# Patient Record
Sex: Female | Born: 1945 | Race: White | Hispanic: No | Marital: Married | Smoking: Never smoker
Health system: Southern US, Community
[De-identification: ages and names within clinical notes are randomized; demographics above are authoritative.]

## PROBLEM LIST (undated history)

## (undated) DIAGNOSIS — E7800 Pure hypercholesterolemia, unspecified: Secondary | ICD-10-CM

## (undated) DIAGNOSIS — E119 Type 2 diabetes mellitus without complications: Secondary | ICD-10-CM

## (undated) DIAGNOSIS — I10 Essential (primary) hypertension: Secondary | ICD-10-CM

## (undated) DIAGNOSIS — J302 Other seasonal allergic rhinitis: Secondary | ICD-10-CM

## (undated) DIAGNOSIS — M4800 Spinal stenosis, site unspecified: Secondary | ICD-10-CM

## (undated) HISTORY — PX: BREAST SURGERY: SHX581

## (undated) HISTORY — PX: CHOLECYSTECTOMY: SHX55

## (undated) HISTORY — PX: ABDOMINAL HYSTERECTOMY: SHX81

## (undated) HISTORY — PX: DILATION AND CURETTAGE OF UTERUS: SHX78

---

## 1997-09-30 ENCOUNTER — Ambulatory Visit (HOSPITAL_COMMUNITY): Admission: RE | Admit: 1997-09-30 | Discharge: 1997-09-30 | Payer: Self-pay | Admitting: *Deleted

## 2000-07-10 ENCOUNTER — Other Ambulatory Visit: Admission: RE | Admit: 2000-07-10 | Discharge: 2000-07-10 | Payer: Self-pay | Admitting: Otolaryngology

## 2002-07-15 ENCOUNTER — Encounter: Payer: Self-pay | Admitting: Orthopedic Surgery

## 2002-07-22 ENCOUNTER — Ambulatory Visit (HOSPITAL_COMMUNITY): Admission: RE | Admit: 2002-07-22 | Discharge: 2002-07-22 | Payer: Self-pay | Admitting: Orthopedic Surgery

## 2003-03-16 ENCOUNTER — Encounter: Admission: RE | Admit: 2003-03-16 | Discharge: 2003-06-14 | Payer: Self-pay | Admitting: Family Medicine

## 2003-04-09 ENCOUNTER — Ambulatory Visit (HOSPITAL_COMMUNITY): Admission: RE | Admit: 2003-04-09 | Discharge: 2003-04-09 | Payer: Self-pay | Admitting: *Deleted

## 2004-06-19 ENCOUNTER — Encounter: Admission: RE | Admit: 2004-06-19 | Discharge: 2004-06-19 | Payer: Self-pay | Admitting: Orthopedic Surgery

## 2004-06-23 ENCOUNTER — Emergency Department (HOSPITAL_COMMUNITY): Admission: EM | Admit: 2004-06-23 | Discharge: 2004-06-24 | Payer: Self-pay | Admitting: Emergency Medicine

## 2005-03-21 ENCOUNTER — Ambulatory Visit: Payer: Self-pay | Admitting: Hematology and Oncology

## 2006-07-17 ENCOUNTER — Encounter: Admission: RE | Admit: 2006-07-17 | Discharge: 2006-07-17 | Payer: Self-pay | Admitting: Orthopedic Surgery

## 2006-07-23 ENCOUNTER — Encounter: Admission: RE | Admit: 2006-07-23 | Discharge: 2006-07-23 | Payer: Self-pay | Admitting: *Deleted

## 2012-02-26 ENCOUNTER — Other Ambulatory Visit: Payer: Self-pay | Admitting: Orthopedic Surgery

## 2012-02-26 DIAGNOSIS — M48061 Spinal stenosis, lumbar region without neurogenic claudication: Secondary | ICD-10-CM

## 2012-03-03 ENCOUNTER — Ambulatory Visit
Admission: RE | Admit: 2012-03-03 | Discharge: 2012-03-03 | Disposition: A | Discharge status: Home / Self Care | Payer: Self-pay | Source: Ambulatory Visit | Attending: Orthopedic Surgery | Admitting: Orthopedic Surgery

## 2012-03-03 ENCOUNTER — Ambulatory Visit
Admission: RE | Admit: 2012-03-03 | Discharge: 2012-03-03 | Disposition: A | Discharge status: Home / Self Care | Payer: Medicare Other | Source: Ambulatory Visit | Attending: Orthopedic Surgery | Admitting: Orthopedic Surgery

## 2012-03-03 ENCOUNTER — Other Ambulatory Visit: Payer: Self-pay | Admitting: Orthopedic Surgery

## 2012-03-03 VITALS — BP 116/73 | HR 79 | Ht 59.0 in | Wt 200.0 lb

## 2012-03-03 DIAGNOSIS — M549 Dorsalgia, unspecified: Secondary | ICD-10-CM

## 2012-03-03 DIAGNOSIS — M48061 Spinal stenosis, lumbar region without neurogenic claudication: Secondary | ICD-10-CM

## 2012-03-03 MED ORDER — IOHEXOL 180 MG/ML  SOLN
15.0000 mL | Freq: Once | INTRAMUSCULAR | Status: AC | PRN
  Administered 2012-03-03: 15 mL via INTRATHECAL

## 2012-03-03 MED ORDER — DIAZEPAM 5 MG PO TABS
5.0000 mg | ORAL_TABLET | Freq: Once | ORAL | Status: AC
  Administered 2012-03-03: 5 mg via ORAL

## 2012-04-01 ENCOUNTER — Encounter (HOSPITAL_COMMUNITY): Payer: Self-pay | Admitting: Pharmacy Technician
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
## 2012-04-04 ENCOUNTER — Ambulatory Visit (HOSPITAL_COMMUNITY)
Admission: RE | Admit: 2012-04-04 | Discharge: 2012-04-04 | Disposition: A | Discharge status: Home / Self Care | Payer: Medicare Other | Source: Ambulatory Visit | Attending: Orthopedic Surgery | Admitting: Orthopedic Surgery

## 2012-04-04 ENCOUNTER — Encounter (HOSPITAL_COMMUNITY)
Admission: RE | Admit: 2012-04-04 | Discharge: 2012-04-04 | Disposition: A | Discharge status: Home / Self Care | Payer: Medicare Other | Source: Ambulatory Visit | Attending: Orthopedic Surgery | Admitting: Orthopedic Surgery

## 2012-04-04 ENCOUNTER — Encounter (HOSPITAL_COMMUNITY): Payer: Self-pay

## 2012-04-04 DIAGNOSIS — M4800 Spinal stenosis, site unspecified: Secondary | ICD-10-CM | POA: Insufficient documentation

## 2012-04-04 DIAGNOSIS — S22009A Unspecified fracture of unspecified thoracic vertebra, initial encounter for closed fracture: Secondary | ICD-10-CM | POA: Insufficient documentation

## 2012-04-04 DIAGNOSIS — X58XXXA Exposure to other specified factors, initial encounter: Secondary | ICD-10-CM | POA: Insufficient documentation

## 2012-04-04 DIAGNOSIS — Z01812 Encounter for preprocedural laboratory examination: Secondary | ICD-10-CM | POA: Insufficient documentation

## 2012-04-04 HISTORY — DX: Spinal stenosis, site unspecified: M48.00

## 2012-04-04 HISTORY — DX: Essential (primary) hypertension: I10

## 2012-04-04 HISTORY — DX: Type 2 diabetes mellitus without complications: E11.9

## 2012-04-04 HISTORY — DX: Other seasonal allergic rhinitis: J30.2

## 2012-04-04 HISTORY — DX: Pure hypercholesterolemia, unspecified: E78.00

## 2012-04-04 LAB — CBC
Hemoglobin: 13.6 g/dL (ref 12.0–15.0)
RBC: 4.61 MIL/uL (ref 3.87–5.11)
WBC: 9.6 10*3/uL (ref 4.0–10.5)

## 2012-04-04 LAB — URINE MICROSCOPIC-ADD ON

## 2012-04-04 LAB — URINALYSIS, ROUTINE W REFLEX MICROSCOPIC
Bilirubin Urine: NEGATIVE
Glucose, UA: NEGATIVE mg/dL
Hgb urine dipstick: NEGATIVE
Ketones, ur: NEGATIVE mg/dL
Nitrite: NEGATIVE
Protein, ur: NEGATIVE mg/dL
Specific Gravity, Urine: 1.021 (ref 1.005–1.030)
Urobilinogen, UA: 0.2 mg/dL (ref 0.0–1.0)
pH: 5 (ref 5.0–8.0)

## 2012-04-04 LAB — COMPREHENSIVE METABOLIC PANEL
ALT: 22 U/L (ref 0–35)
AST: 26 U/L (ref 0–37)
Albumin: 4.1 g/dL (ref 3.5–5.2)
Alkaline Phosphatase: 80 U/L (ref 39–117)
BUN: 25 mg/dL — ABNORMAL HIGH (ref 6–23)
CO2: 26 mEq/L (ref 19–32)
Calcium: 10.4 mg/dL (ref 8.4–10.5)
Chloride: 102 mEq/L (ref 96–112)
Creatinine, Ser: 1.03 mg/dL (ref 0.50–1.10)
GFR calc Af Amer: 65 mL/min — ABNORMAL LOW (ref >90)
GFR calc non Af Amer: 56 mL/min — ABNORMAL LOW (ref >90)
Glucose, Bld: 102 mg/dL — ABNORMAL HIGH (ref 70–99)
Potassium: 4.9 mEq/L (ref 3.5–5.1)
Sodium: 138 mEq/L (ref 135–145)
Total Bilirubin: 0.4 mg/dL (ref 0.3–1.2)
Total Protein: 8 g/dL (ref 6.0–8.3)

## 2012-04-04 LAB — PROTIME-INR
INR: 1.05 (ref 0.00–1.49)
Prothrombin Time: 13.6 seconds (ref 11.6–15.2)

## 2012-04-04 LAB — APTT: aPTT: 41 seconds — ABNORMAL HIGH (ref 24–37)

## 2012-04-04 LAB — SURGICAL PCR SCREEN
MRSA, PCR: NEGATIVE
Staphylococcus aureus: NEGATIVE

## 2012-04-04 NOTE — Pre-Procedure Instructions (Addendum)
04-04-12 CXR, Lumbar Spine X-ray done today . EKG 12'13 -report with chart. 04-04-12 1410- fax to Dr. Darrelyn Hillock -labs viewable in Epic.Cindy Glover

## 2012-04-04 NOTE — Progress Notes (Signed)
04-04-12 Labs viewable in Epic with Presurgical testing visit. Note please.W. Kennon Portela

## 2012-04-04 NOTE — Patient Instructions (Addendum)
20 Cindy Glover  04/04/2012   Your procedure is scheduled on:  1- 15 -2014  Report to [facility] at   0900     AM..  Call this number if you have problems the morning of surgery: [phone]  Or Presurgical Testing [phone](Cathi Hazan)   Remember: Follow any bowel prep instructions per MD office. For Cpap use: Bring mask and tubing only.   Do not eat food:After Midnight.    Take these medicines the morning of surgery with A SIP OF WATER: Caduet, Zetia. Do not take any Diabetic meds Am of Sugery.   Do not wear jewelry, make-up or nail polish.  Do not wear lotions, powders, or perfumes. You may wear deodorant.  Do not shave 12 hours prior to first CHG shower(legs and under arms).(face and neck okay.)  Do not bring valuables to the hospital.  Contacts, dentures or bridgework,body piercing,  may not be worn into surgery.  Leave suitcase in the car. After surgery it may be brought to your room.  For patients admitted to the hospital, checkout time is 11:00 AM the day of discharge.   Patients discharged the day of surgery will not be allowed to drive home. Must have responsible person with you x 24 hours once discharged.  Name and phone number of your driver:Mike Coffin,spouse [phone] cell   Special Instructions: CHG Shower Use Special Wash: see special instructions.(avoid face and genitals)   Please read over the following fact sheets that you were given: MRSA Information, Incentive Spirometry Instruction.    Failure to follow these instructions may result in Cancellation of your surgery.   Patient signature_______________________________________________________

## 2012-04-09 ENCOUNTER — Encounter (HOSPITAL_COMMUNITY)
Admission: RE | Disposition: A | Discharge status: Home / Self Care | Payer: Self-pay | Source: Ambulatory Visit | Attending: Orthopedic Surgery

## 2012-04-09 ENCOUNTER — Inpatient Hospital Stay (HOSPITAL_COMMUNITY): Payer: Medicare Other | Admitting: Anesthesiology

## 2012-04-09 ENCOUNTER — Encounter (HOSPITAL_COMMUNITY): Payer: Self-pay | Admitting: Anesthesiology

## 2012-04-09 ENCOUNTER — Encounter (HOSPITAL_COMMUNITY): Payer: Self-pay | Admitting: *Deleted

## 2012-04-09 ENCOUNTER — Inpatient Hospital Stay (HOSPITAL_COMMUNITY): Payer: Medicare Other

## 2012-04-09 ENCOUNTER — Inpatient Hospital Stay (HOSPITAL_COMMUNITY)
Admission: RE | Admit: 2012-04-09 | Discharge: 2012-04-11 | DRG: 490 | Disposition: A | Discharge status: Home / Self Care | Payer: Medicare Other | Source: Ambulatory Visit | Attending: Orthopedic Surgery | Admitting: Orthopedic Surgery

## 2012-04-09 DIAGNOSIS — I10 Essential (primary) hypertension: Secondary | ICD-10-CM | POA: Diagnosis present

## 2012-04-09 DIAGNOSIS — M5416 Radiculopathy, lumbar region: Secondary | ICD-10-CM | POA: Diagnosis present

## 2012-04-09 DIAGNOSIS — IMO0002 Reserved for concepts with insufficient information to code with codable children: Secondary | ICD-10-CM | POA: Diagnosis present

## 2012-04-09 DIAGNOSIS — E7800 Pure hypercholesterolemia, unspecified: Secondary | ICD-10-CM | POA: Diagnosis present

## 2012-04-09 DIAGNOSIS — E119 Type 2 diabetes mellitus without complications: Secondary | ICD-10-CM | POA: Diagnosis present

## 2012-04-09 DIAGNOSIS — M48061 Spinal stenosis, lumbar region without neurogenic claudication: Principal | ICD-10-CM | POA: Diagnosis present

## 2012-04-09 DIAGNOSIS — Z9889 Other specified postprocedural states: Secondary | ICD-10-CM

## 2012-04-09 DIAGNOSIS — Z6841 Body Mass Index (BMI) 40.0 and over, adult: Secondary | ICD-10-CM

## 2012-04-09 HISTORY — PX: DECOMPRESSIVE LUMBAR LAMINECTOMY LEVEL 2: SHX5792

## 2012-04-09 LAB — GLUCOSE, CAPILLARY: Glucose-Capillary: 163 mg/dL — ABNORMAL HIGH (ref 70–99)

## 2012-04-09 SURGERY — DECOMPRESSIVE LUMBAR LAMINECTOMY LEVEL 2
Anesthesia: General | Site: Spine Lumbar | Wound class: Clean

## 2012-04-09 MED ORDER — RAMIPRIL 10 MG PO CAPS
10.0000 mg | ORAL_CAPSULE | Freq: Every day | ORAL | Status: DC
  Administered 2012-04-10 – 2012-04-11 (×2): 10 mg via ORAL
  Filled 2012-04-09 (×2): qty 1

## 2012-04-09 MED ORDER — PROPOFOL 10 MG/ML IV BOLUS
INTRAVENOUS | Status: DC | PRN
  Administered 2012-04-09: 150 mg via INTRAVENOUS

## 2012-04-09 MED ORDER — ROCURONIUM BROMIDE 100 MG/10ML IV SOLN
INTRAVENOUS | Status: DC | PRN
  Administered 2012-04-09 (×2): 10 mg via INTRAVENOUS
  Administered 2012-04-09: 30 mg via INTRAVENOUS

## 2012-04-09 MED ORDER — BISACODYL 10 MG RE SUPP: 10.0000 mg | Freq: Every day | RECTAL | Status: DC | PRN

## 2012-04-09 MED ORDER — THROMBIN 5000 UNITS EX SOLR
CUTANEOUS | Status: DC | PRN
  Administered 2012-04-09: 5000 [IU] via TOPICAL

## 2012-04-09 MED ORDER — MEPERIDINE HCL 50 MG/ML IJ SOLN: 6.2500 mg | INTRAMUSCULAR | Status: DC | PRN

## 2012-04-09 MED ORDER — ONDANSETRON HCL 4 MG/2ML IJ SOLN
4.0000 mg | INTRAMUSCULAR | Status: DC | PRN
  Administered 2012-04-09 – 2012-04-10 (×3): 4 mg via INTRAVENOUS
  Filled 2012-04-09 (×3): qty 2

## 2012-04-09 MED ORDER — GLIPIZIDE ER 10 MG PO TB24
10.0000 mg | ORAL_TABLET | Freq: Every day | ORAL | Status: DC
  Administered 2012-04-10 – 2012-04-11 (×2): 10 mg via ORAL
  Filled 2012-04-09 (×3): qty 1

## 2012-04-09 MED ORDER — HEMOSTATIC AGENTS (NO CHARGE) OPTIME
TOPICAL | Status: DC | PRN
  Administered 2012-04-09: 1 via TOPICAL

## 2012-04-09 MED ORDER — ATORVASTATIN CALCIUM 40 MG PO TABS
40.0000 mg | ORAL_TABLET | Freq: Every day | ORAL | Status: DC
  Administered 2012-04-10 – 2012-04-11 (×2): 40 mg via ORAL
  Filled 2012-04-09 (×2): qty 1

## 2012-04-09 MED ORDER — PROMETHAZINE HCL 25 MG/ML IJ SOLN: 6.2500 mg | INTRAMUSCULAR | Status: DC | PRN

## 2012-04-09 MED ORDER — BUPIVACAINE LIPOSOME 1.3 % IJ SUSP
20.0000 mL | Freq: Once | INTRAMUSCULAR | Status: AC
  Administered 2012-04-09: 20 mL
  Filled 2012-04-09: qty 20

## 2012-04-09 MED ORDER — SUCCINYLCHOLINE CHLORIDE 20 MG/ML IJ SOLN
INTRAMUSCULAR | Status: DC | PRN
  Administered 2012-04-09: 100 mg via INTRAVENOUS

## 2012-04-09 MED ORDER — HYDROCHLOROTHIAZIDE 25 MG PO TABS
25.0000 mg | ORAL_TABLET | Freq: Every day | ORAL | Status: DC | PRN
  Filled 2012-04-09: qty 1

## 2012-04-09 MED ORDER — METHOCARBAMOL 100 MG/ML IJ SOLN
500.0000 mg | Freq: Four times a day (QID) | INTRAVENOUS | Status: DC | PRN
  Administered 2012-04-09 (×2): 500 mg via INTRAVENOUS
  Filled 2012-04-09 (×2): qty 5

## 2012-04-09 MED ORDER — SODIUM CHLORIDE 0.9 % IR SOLN
Status: DC | PRN
  Administered 2012-04-09: 13:00:00

## 2012-04-09 MED ORDER — ACETAMINOPHEN 10 MG/ML IV SOLN
INTRAVENOUS | Status: DC | PRN
  Administered 2012-04-09: 1000 mg via INTRAVENOUS

## 2012-04-09 MED ORDER — OXYCODONE HCL 5 MG PO TABS: 5.0000 mg | ORAL_TABLET | Freq: Once | ORAL | Status: DC | PRN

## 2012-04-09 MED ORDER — BACITRACIN-NEOMYCIN-POLYMYXIN 400-5-5000 EX OINT
TOPICAL_OINTMENT | CUTANEOUS | Status: DC | PRN
  Administered 2012-04-09: 1 via TOPICAL

## 2012-04-09 MED ORDER — POLYETHYLENE GLYCOL 3350 17 G PO PACK: 17.0000 g | PACK | Freq: Every day | ORAL | Status: DC | PRN

## 2012-04-09 MED ORDER — ACETAMINOPHEN 650 MG RE SUPP: 650.0000 mg | RECTAL | Status: DC | PRN

## 2012-04-09 MED ORDER — AMLODIPINE-ATORVASTATIN 10-40 MG PO TABS: 1.0000 | ORAL_TABLET | Freq: Every morning | ORAL | Status: DC

## 2012-04-09 MED ORDER — CEFAZOLIN SODIUM-DEXTROSE 2-3 GM-% IV SOLR
2.0000 g | INTRAVENOUS | Status: AC
  Administered 2012-04-09: 2 g via INTRAVENOUS

## 2012-04-09 MED ORDER — LACTATED RINGERS IV SOLN
INTRAVENOUS | Status: DC
  Administered 2012-04-09 – 2012-04-10 (×2): via INTRAVENOUS

## 2012-04-09 MED ORDER — PANTOPRAZOLE SODIUM 40 MG PO TBEC
40.0000 mg | DELAYED_RELEASE_TABLET | Freq: Every day | ORAL | Status: DC
  Administered 2012-04-10 – 2012-04-11 (×2): 40 mg via ORAL
  Filled 2012-04-09 (×3): qty 1

## 2012-04-09 MED ORDER — METHOCARBAMOL 500 MG PO TABS
500.0000 mg | ORAL_TABLET | Freq: Four times a day (QID) | ORAL | Status: DC | PRN
  Administered 2012-04-10 (×2): 500 mg via ORAL
  Filled 2012-04-09 (×2): qty 1

## 2012-04-09 MED ORDER — MENTHOL 3 MG MT LOZG
1.0000 | LOZENGE | OROMUCOSAL | Status: DC | PRN
  Administered 2012-04-11: 3 mg via ORAL
  Filled 2012-04-09: qty 9

## 2012-04-09 MED ORDER — BUPIVACAINE-EPINEPHRINE PF 0.25-1:200000 % IJ SOLN
INTRAMUSCULAR | Status: DC | PRN
  Administered 2012-04-09: 30 mL

## 2012-04-09 MED ORDER — OXYCODONE-ACETAMINOPHEN 5-325 MG PO TABS
1.0000 | ORAL_TABLET | ORAL | Status: DC | PRN
  Administered 2012-04-09 – 2012-04-10 (×3): 1 via ORAL
  Administered 2012-04-10: 2 via ORAL
  Administered 2012-04-10 – 2012-04-11 (×3): 1 via ORAL
  Filled 2012-04-09 (×2): qty 1
  Filled 2012-04-09: qty 2
  Filled 2012-04-09 (×4): qty 1

## 2012-04-09 MED ORDER — NEOSTIGMINE METHYLSULFATE 1 MG/ML IJ SOLN
INTRAMUSCULAR | Status: DC | PRN
  Administered 2012-04-09: 5 mg via INTRAVENOUS

## 2012-04-09 MED ORDER — INSULIN ASPART 100 UNIT/ML ~~LOC~~ SOLN
0.0000 [IU] | Freq: Three times a day (TID) | SUBCUTANEOUS | Status: DC
Start: 2012-04-09 — End: 2012-04-11
  Administered 2012-04-09: 2 [IU] via SUBCUTANEOUS

## 2012-04-09 MED ORDER — FLEET ENEMA 7-19 GM/118ML RE ENEM: 1.0000 | ENEMA | Freq: Once | RECTAL | Status: AC | PRN

## 2012-04-09 MED ORDER — ACETAMINOPHEN 325 MG PO TABS
650.0000 mg | ORAL_TABLET | ORAL | Status: DC | PRN
  Administered 2012-04-10: 650 mg via ORAL
  Filled 2012-04-09: qty 2

## 2012-04-09 MED ORDER — OXYCODONE HCL 5 MG/5ML PO SOLN
5.0000 mg | Freq: Once | ORAL | Status: DC | PRN
  Filled 2012-04-09: qty 5

## 2012-04-09 MED ORDER — PIOGLITAZONE HCL 45 MG PO TABS
45.0000 mg | ORAL_TABLET | Freq: Every day | ORAL | Status: DC
  Administered 2012-04-10 – 2012-04-11 (×2): 45 mg via ORAL
  Filled 2012-04-09 (×2): qty 1

## 2012-04-09 MED ORDER — HYDROMORPHONE HCL PF 1 MG/ML IJ SOLN: 0.5000 mg | INTRAMUSCULAR | Status: DC | PRN

## 2012-04-09 MED ORDER — FENTANYL CITRATE 0.05 MG/ML IJ SOLN
INTRAMUSCULAR | Status: DC | PRN
  Administered 2012-04-09 (×2): 50 ug via INTRAVENOUS

## 2012-04-09 MED ORDER — GLYCOPYRROLATE 0.2 MG/ML IJ SOLN
INTRAMUSCULAR | Status: DC | PRN
  Administered 2012-04-09: 0.6 mg via INTRAVENOUS

## 2012-04-09 MED ORDER — MIDAZOLAM HCL 5 MG/5ML IJ SOLN
INTRAMUSCULAR | Status: DC | PRN
  Administered 2012-04-09: 2 mg via INTRAVENOUS

## 2012-04-09 MED ORDER — EZETIMIBE 10 MG PO TABS
10.0000 mg | ORAL_TABLET | Freq: Every day | ORAL | Status: DC
  Administered 2012-04-10 – 2012-04-11 (×2): 10 mg via ORAL
  Filled 2012-04-09 (×2): qty 1

## 2012-04-09 MED ORDER — PHENOL 1.4 % MT LIQD: 1.0000 | OROMUCOSAL | Status: DC | PRN

## 2012-04-09 MED ORDER — LIDOCAINE HCL (CARDIAC) 20 MG/ML IV SOLN
INTRAVENOUS | Status: DC | PRN
  Administered 2012-04-09: 100 mg via INTRAVENOUS

## 2012-04-09 MED ORDER — HYDROMORPHONE HCL PF 1 MG/ML IJ SOLN
0.2500 mg | INTRAMUSCULAR | Status: DC | PRN
  Administered 2012-04-09 (×2): 0.5 mg via INTRAVENOUS

## 2012-04-09 MED ORDER — CEFAZOLIN SODIUM 1-5 GM-% IV SOLN
1.0000 g | Freq: Three times a day (TID) | INTRAVENOUS | Status: AC
  Administered 2012-04-09 – 2012-04-10 (×3): 1 g via INTRAVENOUS
  Filled 2012-04-09 (×3): qty 50

## 2012-04-09 MED ORDER — ACETAMINOPHEN 10 MG/ML IV SOLN: 1000.0000 mg | Freq: Once | INTRAVENOUS | Status: DC | PRN

## 2012-04-09 MED ORDER — LACTATED RINGERS IV SOLN
INTRAVENOUS | Status: DC
  Administered 2012-04-09: 1000 mL via INTRAVENOUS
  Administered 2012-04-09 (×2): via INTRAVENOUS

## 2012-04-09 MED ORDER — ONDANSETRON HCL 4 MG/2ML IJ SOLN
INTRAMUSCULAR | Status: DC | PRN
  Administered 2012-04-09: 4 mg via INTRAVENOUS

## 2012-04-09 MED ORDER — HYDROCODONE-ACETAMINOPHEN 5-325 MG PO TABS
1.0000 | ORAL_TABLET | ORAL | Status: DC | PRN
  Filled 2012-04-09: qty 2

## 2012-04-09 MED ORDER — AMLODIPINE BESYLATE 10 MG PO TABS
10.0000 mg | ORAL_TABLET | Freq: Every day | ORAL | Status: DC
  Administered 2012-04-10 – 2012-04-11 (×2): 10 mg via ORAL
  Filled 2012-04-09 (×2): qty 1

## 2012-04-09 SURGICAL SUPPLY — 48 items
APL SKNCLS STERI-STRIP NONHPOA (GAUZE/BANDAGES/DRESSINGS) ×1
BAG SPEC THK2 15X12 ZIP CLS (MISCELLANEOUS) ×1
BAG ZIPLOCK 12X15 (MISCELLANEOUS) ×2 IMPLANT
BENZOIN TINCTURE PRP APPL 2/3 (GAUZE/BANDAGES/DRESSINGS) ×2 IMPLANT
CLEANER TIP ELECTROSURG 2X2 (MISCELLANEOUS) ×2 IMPLANT
CLOTH BEACON ORANGE TIMEOUT ST (SAFETY) ×2 IMPLANT
CONT SPECI 4OZ STER CLIK (MISCELLANEOUS) ×2 IMPLANT
DRAIN PENROSE 18X1/4 LTX STRL (WOUND CARE) IMPLANT
DRAPE MICROSCOPE LEICA (MISCELLANEOUS) ×2 IMPLANT
DRAPE POUCH INSTRU U-SHP 10X18 (DRAPES) ×2 IMPLANT
DRAPE SURG 17X11 SM STRL (DRAPES) ×2 IMPLANT
DRSG ADAPTIC 3X8 NADH LF (GAUZE/BANDAGES/DRESSINGS) ×2 IMPLANT
DRSG PAD ABDOMINAL 8X10 ST (GAUZE/BANDAGES/DRESSINGS) ×2 IMPLANT
DURAPREP 26ML APPLICATOR (WOUND CARE) ×2 IMPLANT
ELECT REM PT RETURN 9FT ADLT (ELECTROSURGICAL) ×2
ELECTRODE REM PT RTRN 9FT ADLT (ELECTROSURGICAL) ×1 IMPLANT
FLOSEAL 10ML (HEMOSTASIS) ×4 IMPLANT
GLOVE BIOGEL PI IND STRL 8 (GLOVE) ×1 IMPLANT
GLOVE BIOGEL PI IND STRL 8.5 (GLOVE) ×1 IMPLANT
GLOVE BIOGEL PI INDICATOR 8 (GLOVE) ×1
GLOVE BIOGEL PI INDICATOR 8.5 (GLOVE) ×1
GLOVE ECLIPSE 8.0 STRL XLNG CF (GLOVE) ×4 IMPLANT
GOWN PREVENTION PLUS LG XLONG (DISPOSABLE) ×4 IMPLANT
GOWN STRL REIN XL XLG (GOWN DISPOSABLE) ×4 IMPLANT
KIT BASIN OR (CUSTOM PROCEDURE TRAY) ×2 IMPLANT
KIT POSITIONING SURG ANDREWS (MISCELLANEOUS) ×2 IMPLANT
MANIFOLD NEPTUNE II (INSTRUMENTS) ×2 IMPLANT
NEEDLE SPNL 18GX3.5 QUINCKE PK (NEEDLE) ×4 IMPLANT
NS IRRIG 1000ML POUR BTL (IV SOLUTION) ×2 IMPLANT
PATTIES SURGICAL .5 X.5 (GAUZE/BANDAGES/DRESSINGS) IMPLANT
PATTIES SURGICAL .75X.75 (GAUZE/BANDAGES/DRESSINGS) IMPLANT
PATTIES SURGICAL 1X1 (DISPOSABLE) IMPLANT
PIN SAFETY NICK PLATE  2 MED (MISCELLANEOUS)
PIN SAFETY NICK PLATE 2 MED (MISCELLANEOUS) IMPLANT
POSITIONER SURGICAL ARM (MISCELLANEOUS) ×2 IMPLANT
SPONGE GAUZE 4X4 12PLY (GAUZE/BANDAGES/DRESSINGS) ×1 IMPLANT
SPONGE LAP 4X18 X RAY DECT (DISPOSABLE) ×4 IMPLANT
SPONGE SURGIFOAM ABS GEL 100 (HEMOSTASIS) ×2 IMPLANT
STAPLER VISISTAT 35W (STAPLE) IMPLANT
SUT VIC AB 0 CT1 27 (SUTURE) ×2
SUT VIC AB 0 CT1 27XBRD ANTBC (SUTURE) ×1 IMPLANT
SUT VIC AB 1 CT1 27 (SUTURE) ×8
SUT VIC AB 1 CT1 27XBRD ANTBC (SUTURE) ×4 IMPLANT
TAPE CLOTH SOFT 2X10 (GAUZE/BANDAGES/DRESSINGS) ×1 IMPLANT
TOWEL OR 17X26 10 PK STRL BLUE (TOWEL DISPOSABLE) ×4 IMPLANT
TRAY FOLEY CATH 14FRSI W/METER (CATHETERS) ×2 IMPLANT
TRAY LAMINECTOMY (CUSTOM PROCEDURE TRAY) ×2 IMPLANT
WATER STERILE IRR 1500ML POUR (IV SOLUTION) ×2 IMPLANT

## 2012-04-09 NOTE — Brief Op Note (Signed)
04/09/2012  1:51 PM  PATIENT:  Cindy Glover  67 y.o. female  PRE-OPERATIVE DIAGNOSIS:  SPINAL STENOSIS at L-4-L-5and L-5-S-1  POST-OPERATIVE DIAGNOSIS:  SPINAL STENOSIS at L-4-L-5 and L-5-S-1 with Foraminal Stenosis bilaterally at both levels.  PROCEDURE:  Procedure(s) (LRB) with comments: DECOMPRESSIVE LUMBAR LAMINECTOMY LEVEL 2 (N/A) - COMPLETE LAMINECTOMY L5 ON THE LEFT AND DECOMPRESSION L5-S1 AND NERVE ROOT ON THE LEFT.Actually we did a complete Decompressive Lumbar Laminectomies at L-4-L-5 and L-5-S-1 with Foraminotimies Bilaterally at Two levels.  SURGEON:  Surgeon(s) and Role:    * Jacki Cones, MD - Primary    * Drucilla Schmidt, MD - Assisting    ASSISTANTS: Marlowe Kays MD   ANESTHESIA:   general  EBL:  Total I/O In: 2000 [I.V.:2000] Out: 125 [Urine:75; Blood:50]  BLOOD ADMINISTERED:none  DRAINS: none   LOCAL MEDICATIONS USED:  MARCAINE 30cc of 0.25% with Epinephrine and at the END of the Procedure we used 20cc of Exparel.     SPECIMEN:  No Specimen  DISPOSITION OF SPECIMEN:  N/A  COUNTS:  YES  TOURNIQUET:  * No tourniquets in log *  DICTATION: .Other Dictation: Dictation Number [id number]  PLAN OF CARE: Admit to inpatient   PATIENT DISPOSITION:  PACU - hemodynamically stable.   Delay start of Pharmacological VTE agent (>24hrs) due to surgical blood loss or risk of bleeding: yes

## 2012-04-09 NOTE — Anesthesia Postprocedure Evaluation (Signed)
Anesthesia Post Note  Patient: Cindy Glover  Procedure(s) Performed: Procedure(s) (LRB): DECOMPRESSIVE LUMBAR LAMINECTOMY LEVEL 2 (N/A)  Anesthesia type: General  Patient location: PACU  Post pain: Pain level controlled  Post assessment: Post-op Vital signs reviewed  Last Vitals: BP 109/39  Pulse 78  Temp 36.7 C (Oral)  Resp 16  SpO2 100%  Post vital signs: Reviewed  Level of consciousness: sedated  Complications: No apparent anesthesia complications

## 2012-04-09 NOTE — Anesthesia Preprocedure Evaluation (Addendum)
Anesthesia Evaluation  Patient identified by MRN, date of birth, ID band Patient awake    Reviewed: Allergy & Precautions, H&P , NPO status , Patient's Chart, lab work & pertinent test results  Airway Mallampati: II TM Distance: >3 FB Neck ROM: Full    Dental  (+) Dental Advisory Given and Teeth Intact   Pulmonary neg pulmonary ROS,  breath sounds clear to auscultation  Pulmonary exam normal       Cardiovascular hypertension, Pt. on medications Rhythm:Regular Rate:Normal     Neuro/Psych negative neurological ROS  negative psych ROS   GI/Hepatic negative GI ROS, Neg liver ROS,   Endo/Other  diabetes, Type obesity  Renal/GU negative Renal ROS     Musculoskeletal negative musculoskeletal ROS (+)   Abdominal (+) + obese,   Peds  Hematology negative hematology ROS (+)   Anesthesia Other Findings   Reproductive/Obstetrics                          Anesthesia Physical Anesthesia Plan  ASA: III  Anesthesia Plan: General   Post-op Pain Management:    Induction: Intravenous  Airway Management Planned: Oral ETT  Additional Equipment:   Intra-op Plan:   Post-operative Plan: Extubation in OR  Informed Consent: I have reviewed the patients History and Physical, chart, labs and discussed the procedure including the risks, benefits and alternatives for the proposed anesthesia with the patient or authorized representative who has indicated his/her understanding and acceptance.   Dental advisory given  Plan Discussed with: CRNA  Anesthesia Plan Comments:         Anesthesia Quick Evaluation

## 2012-04-09 NOTE — Transfer of Care (Signed)
Immediate Anesthesia Transfer of Care Note  Patient: Cindy Glover  Procedure(s) Performed: Procedure(s) (LRB) with comments: DECOMPRESSIVE LUMBAR LAMINECTOMY LEVEL 2 (N/A) - COMPLETE LAMINECTOMY L5 ON THE LEFT AND DECOMPRESSION L5-S1 AND NERVE ROOT ON THE LEFT  Patient Location: PACU  Anesthesia Type:General  Level of Consciousness: sedated  Airway & Oxygen Therapy: Patient Spontanous Breathing and Patient connected to face mask oxygen  Post-op Assessment: Report given to PACU RN and Post -op Vital signs reviewed and stable  Post vital signs: Reviewed and stable  Complications: No apparent anesthesia complications

## 2012-04-09 NOTE — Interval H&P Note (Signed)
History and Physical Interval Note:  04/09/2012 11:26 AM  Cindy Glover  has presented today for surgery, with the diagnosis of SPINAL STENOSIS  The various methods of treatment have been discussed with the patient and family. After consideration of risks, benefits and other options for treatment, the patient has consented to  Procedure(s) (LRB) with comments: DECOMPRESSIVE LUMBAR LAMINECTOMY LEVEL 2 (N/A) - COMPLETE LAMINECTOMY L5 ON THE LEFT AND DECOMPRESSION L5-S1 AND NERVE ROOT ON THE LEFT as a surgical intervention .  The patient's history has been reviewed, patient examined, no change in status, stable for surgery.  I have reviewed the patient's chart and labs.  Questions were answered to the patient's satisfaction.     Christian Borgerding A

## 2012-04-10 ENCOUNTER — Encounter (HOSPITAL_COMMUNITY): Payer: Self-pay | Admitting: Orthopedic Surgery

## 2012-04-10 LAB — GLUCOSE, CAPILLARY
Glucose-Capillary: 118 mg/dL — ABNORMAL HIGH (ref 70–99)
Glucose-Capillary: 119 mg/dL — ABNORMAL HIGH (ref 70–99)

## 2012-04-10 MED FILL — Bacitracin Zinc Oint 500 Unit/GM: CUTANEOUS | Qty: 15 | Status: AC

## 2012-04-10 NOTE — Op Note (Signed)
Cindy Glover, RULAND NO.:  [id number]  MEDICAL RECORD NO.:  [id number]  LOCATION:  1607                         FACILITY:  [facility]  PHYSICIAN:  Georges Lynch. Blessyn [place], M.D.DATE OF BIRTH:  Aug 20, 1945  DATE OF PROCEDURE:  04/08/2012 DATE OF DISCHARGE:                              OPERATIVE REPORT   PREOPERATIVE DIAGNOSES: 1. Spinal stenosis at L4-5. 2. Spinal stenosis at L5-S1. 3. Foraminal stenosis at L4-5 bilaterally. 4. Foraminal stenosis at L5-S1 bilaterally.  POSTOPERATIVE DIAGNOSES: 1. Spinal stenosis at L4-5. 2. 2. Spinal stenosis at L5-S1. 3. Foraminal stenosis at L4-5 bilaterally. 4. Foraminal stenosis at L5-S1 bilaterally.  OPERATION: 1. Decompressive lumbar laminectomy at L4-5. 2. Decompressive lumbar laminectomy at L5-S1. 3. Foraminotomies bilaterally for the L5 and S1 roots.  SURGEON:  Georges Lynch. Darrelyn Hillock, M.D.  ASSISTANT:  Marlowe Kays, M.D.  DESCRIPTION OF THE PROCEDURE:  Under general anesthesia with the patient on a spinal frame, routine orthopedic prep and draping of the lower back was carried out.  The appropriate time-out was carried out first, before any incisions were made.  I also marked the appropriate left side of the back only preop in the holding area because his symptoms were mainly on the left.  The procedure under general anesthesia with the patient on a spinal frame.  After the time-out as I mentioned was done, 2 needles were placed in the back after the prep.  X-ray was taken to verify the position.  At this time, incision was made over L4-5, L5-S1 in the usual fashion.  Bleeders were identified and cauterized.  At this time, the muscle was stripped from the lamina and spinous process bilaterally. Bleeders were identified and cauterized.  Self-retaining retractors were inserted.  Another x-ray was taken to verify the position.  I then went down and removed the spinous process in usual fashion of L4 and L5.  We then completed  our decompressive lumbar laminectomy.  The microscope was brought in during the procedure.  At this time, we carefully removed the ligamentum flavum.  The spinal canal was excisionally narrow at L4-5 and L5-S1.  We went out and decompressed the lateral recesses, did foraminal decompressions for foraminal stenosis.  We did a very careful dissection because of the severe narrowing at L4-5.  Note, we did not stop the decompression until we were confident that we could freely insert the hockey-stick proximally, distally, and laterally and out the foramina. The area was nicely wide open distally and proximally.  We thoroughly irrigated out the area, and because of the continuous slow oozing of blood, we first obtained good hemostasis, and then I injected 10 mL of FloSeal into the soft tissue areas and over the dura, removed portion of that material from the dura.  I loosely applied some thrombin-soaked Gelfoam around the bony edges there.  I then closed the wound layers in usual fashion.  I did leave a small deep distal part of the wound open and proximal part for drainage purposes.  Subcu was closed with 0- Vicryl, skin with metal staples.  Now, the beginning procedure I used 30 mL of 0.25% Marcaine with epinephrine, to control soft tissue bleeding. At the  very end of the procedure, I injected 20 mL of Exparel into the muscle and soft tissue, subcutaneous tissue.  Sterile dressings were applied after we closed the wound.  The patient had 2 g of IV Ancef, and the patient left the room in satisfactory condition.          ______________________________ Georges Lynch Darrelyn Hillock, M.D.     RAG/MEDQ  D:  04/09/2012  T:  04/10/2012  Job:  [id number]

## 2012-04-10 NOTE — Progress Notes (Signed)
Utilization review completed.  

## 2012-04-10 NOTE — Evaluation (Signed)
Physical Therapy Evaluation Patient Details Name: Cindy Glover MRN: [id number] DOB: July 05, 1945 Today's Date: 04/10/2012 Time: 7829-5621 PT Time Calculation (min): 24 min  PT Assessment / Plan / Recommendation Clinical Impression  s/p back surgery and will benefit form 1-2 more Pt visits to reinforce precautions and perform stair training    PT Assessment  Patient needs continued PT services    Follow Up Recommendations  No PT follow up;Outpatient PT (per MD)    Does the patient have the potential to tolerate intense rehabilitation      Barriers to Discharge        Equipment Recommendations  Rolling walker with 5" wheels    Recommendations for Other Services     Frequency Other (Comment) (1-2visits)    Precautions / Restrictions Precautions Precautions: Back Precaution Booklet Issued: Yes (comment) Precaution Comments: reviewed back precautions Restrictions Weight Bearing Restrictions: No   Pertinent Vitals/Pain Pain controlled      Mobility  Bed Mobility Bed Mobility: Rolling Left;Left Sidelying to Sit;Sit to Sidelying Left Rolling Left: 4: Min guard;4: Min assist Left Sidelying to Sit: 4: Min assist;4: Min guard Sit to Sidelying Left: 4: Min guard;4: Min assist Details for Bed Mobility Assistance: cues for log roll, technique Transfers Transfers: Sit to Stand;Stand to Sit Sit to Stand: 4: Min assist;4: Min guard;From bed Stand to Sit: 4: Min assist;4: Min guard;To bed Details for Transfer Assistance: cues for hand placement Ambulation/Gait Ambulation/Gait Assistance: 4: Min guard Ambulation Distance (Feet): 300 Feet Assistive device: Rolling walker Ambulation/Gait Assistance Details: LOB x 1 with min to recover; cues for step through initially Gait Pattern: Step-through pattern    Shoulder Instructions     Exercises     PT Diagnosis: Difficulty walking  PT Problem List: Decreased mobility;Decreased knowledge of use of DME;Decreased balance PT Treatment  Interventions: Gait training;Stair training;Patient/family education;DME instruction   PT Goals Acute Rehab PT Goals PT Goal Formulation: With patient Time For Goal Achievement: 04/12/12 Potential to Achieve Goals: Good Pt will go Supine/Side to Sit: with supervision PT Goal: Supine/Side to Sit - Progress: Goal set today Pt will go Sit to Supine/Side: with supervision PT Goal: Sit to Supine/Side - Progress: Goal set today Pt will go Sit to Stand: with supervision PT Goal: Sit to Stand - Progress: Goal set today Pt will go Stand to Sit: with supervision PT Goal: Stand to Sit - Progress: Goal set today Pt will Ambulate: with modified independence;16 - 50 feet;with least restrictive assistive device PT Goal: Ambulate - Progress: Goal set today Pt will Go Up / Down Stairs: 3-5 stairs;with min assist;with rail(s) PT Goal: Up/Down Stairs - Progress: Goal set today  Visit Information  Last PT Received On: 04/10/12 Assistance Needed: +1    Subjective Data  Subjective: I got a little nauseated Patient Stated Goal: home   Prior Functioning  Home Living Lives With: Spouse Available Help at Discharge: Family Type of Home: House Home Access: Stairs to enter Secretary/administrator of Steps: 4 Entrance Stairs-Rails: Can reach both;Right;Left Home Layout: One level Firefighter: Standard Home Adaptive Equipment: None Prior Function Level of Independence: Independent Able to Take Stairs?: Yes Driving: Yes    Cognition  Overall Cognitive Status: Appears within functional limits for tasks assessed/performed Arousal/Alertness: Awake/alert Orientation Level: Appears intact for tasks assessed Behavior During Session: [facility] for tasks performed    Extremity/Trunk Assessment Right Lower Extremity Assessment RLE ROM/Strength/Tone: [facility] for tasks assessed Left Lower Extremity Assessment LLE ROM/Strength/Tone: [facility] for tasks assessed   Balance  End of Session PT - End of Session Activity  Tolerance: Patient tolerated treatment well Patient left: in bed;with call bell/phone within reach  GP     [facility] 04/10/2012, 12:29 PM

## 2012-04-10 NOTE — Progress Notes (Signed)
Subjective: 1 Day Post-Op Procedure(s) (LRB): DECOMPRESSIVE LUMBAR LAMINECTOMY LEVEL 2 (N/A) Patient reports pain as 2 on 0-10 scale.    Objective: Vital signs in last 24 hours: Temp:  [97.3 F (36.3 C)-99.3 F (37.4 C)] 99.3 F (37.4 C) (01/16 0527) Pulse Rate:  [74-90] 74  (01/16 0527) Resp:  [16-18] 16  (01/16 0527) BP: (106-143)/(39-78) 109/70 mmHg (01/16 0527) SpO2:  [94 %-100 %] 94 % (01/16 0527) Weight:  [90.266 kg (199 lb)] 90.266 kg (199 lb) (01/15 1500)  Intake/Output from previous day: 01/15 0701 - 01/16 0700 In: 3030 [P.O.:480; I.V.:2500; IV Piggyback:50] Out: 2650 [Urine:2600; Blood:50] Intake/Output this shift:    No results found for this basename: HGB:5 in the last 72 hours No results found for this basename: WBC:2,RBC:2,HCT:2,PLT:2 in the last 72 hours No results found for this basename: NA:2,K:2,CL:2,CO2:2,BUN:2,CREATININE:2,GLUCOSE:2,CALCIUM:2 in the last 72 hours No results found for this basename: LABPT:2,INR:2 in the last 72 hours  Neurologically intact  Assessment/Plan: 1 Day Post-Op Procedure(s) (LRB): DECOMPRESSIVE LUMBAR LAMINECTOMY LEVEL 2 (N/A) Up with therapydiscontinued Tomorrow if Stable.  Roston Grunewald A 04/10/2012, 7:31 AM

## 2012-04-10 NOTE — Evaluation (Signed)
Occupational Therapy Evaluation Patient Details Name: Cindy Glover MRN: [id number] DOB: 1945-07-02 Today's Date: 04/10/2012 Time: 9528-4132 OT Time Calculation (min): 38 min  OT Assessment / Plan / Recommendation Clinical Impression  This 67 year old female was admitted for L5-S1 decompression.  She will benefit from continued OT to reinforce education and practice bathroom transfers/assess for DME for safe d/c home.    OT Assessment  Patient needs continued OT Services    Follow Up Recommendations  No OT follow up    Barriers to Discharge      Equipment Recommendations   (will further assess for 3:1)    Recommendations for Other Services    Frequency  Min 2X/week    Precautions / Restrictions Precautions Precautions: Back Restrictions Weight Bearing Restrictions: No   Pertinent Vitals/Pain Back 1/10. Sats in 90s on 2 liters    ADL  Grooming: Performed;Set up;Teeth care Where Assessed - Grooming: Supported sitting Upper Body Bathing: Performed;Set up Where Assessed - Upper Body Bathing: Unsupported sitting Lower Body Bathing: Performed;Maximal assistance Where Assessed - Lower Body Bathing: Supported sit to stand Upper Body Dressing: Performed;Minimal assistance (iv) Where Assessed - Upper Body Dressing: Unsupported sitting Lower Body Dressing: Performed;Moderate assistance (used sock aid; simulated pants with reacher) Where Assessed - Lower Body Dressing: Supported sit to Pharmacist, hospital: Mining engineer Method: Surveyor, minerals:  (bed to chair) Toileting - Clothing Manipulation and Hygiene: Simulated;Moderate assistance Where Assessed - Toileting Clothing Manipulation and Hygiene: Standing Equipment Used: Sock aid;Reacher Transfers/Ambulation Related to ADLs: spt only to chair today.  Pt was not familiar with log rolling:  educated on this getting OOB ADL Comments: Reviewed back precautions during adl and  provided handout.  Pt practiced with AE but husband will help with adls    OT Diagnosis: Generalized weakness  OT Problem List: Decreased strength;Decreased activity tolerance;Decreased knowledge of use of DME or AE;Decreased knowledge of precautions OT Treatment Interventions: Self-care/ADL training;DME and/or AE instruction;Patient/family education   OT Goals Acute Rehab OT Goals OT Goal Formulation: With patient Time For Goal Achievement: 04/17/12 Potential to Achieve Goals: Good ADL Goals Pt Will Transfer to Toilet: with supervision;Ambulation;Regular height toilet (with simulated sink next to it) ADL Goal: Toilet Transfer - Progress: Goal set today Pt Will Perform Tub/Shower Transfer: Shower transfer;with supervision;Ambulation (3:1 vs. no DME) ADL Goal: Tub/Shower Transfer - Progress: Goal set today Miscellaneous OT Goals Miscellaneous OT Goal #1: Pt will verbalize use of AE and modifications for ADLs to follow back precautions without cues OT Goal: Miscellaneous Goal #1 - Progress: Goal set today  Visit Information  Last OT Received On: 04/10/12    Subjective Data  Subjective: I fractured my back in '08 and worked 2 weeks like that.  I didn't have surgery Patient Stated Goal: get independent   Prior Functioning     Home Living Lives With: Spouse Available Help at Discharge: Family Type of Home: House Home Access: Stairs to enter [employer] of Steps: 4 in back--can reach both Entrance Stairs-Rails: Can reach both Home Layout: One level Bathroom Shower/Tub: Health visitor: Standard (sink next to it) Home Adaptive Equipment: None Additional Comments: stands in shower Prior Function Level of Independence: Independent Able to Take Stairs?: Yes Driving: Yes Communication Communication: No difficulties         Vision/Perception     Cognition  Overall Cognitive Status: Appears within functional limits for tasks  assessed/performed Arousal/Alertness: Awake/alert Orientation Level: Appears intact for tasks assessed Behavior  During Session: [facility] for tasks performed    Extremity/Trunk Assessment Right Upper Extremity Assessment RUE ROM/Strength/Tone: [facility] for tasks assessed Left Upper Extremity Assessment LUE ROM/Strength/Tone: [facility] for tasks assessed     Mobility Bed Mobility Bed Mobility: Rolling Left;Left Sidelying to Sit Rolling Left: 4: Min assist Left Sidelying to Sit: 4: Min assist Transfers Transfers: Sit to Stand Sit to Stand: 4: Min assist     Shoulder Instructions     Exercise     Balance     End of Session OT - End of Session Activity Tolerance: Patient tolerated treatment well Patient left: in chair;with call bell/phone within reach Nurse Communication: Mobility status  GO     Krishay Faro 04/10/2012, 9:00 AM Cindy Glover, OTR/L [phone] 04/10/2012

## 2012-04-11 LAB — GLUCOSE, CAPILLARY: Glucose-Capillary: 112 mg/dL — ABNORMAL HIGH (ref 70–99)

## 2012-04-11 MED ORDER — METHOCARBAMOL 500 MG PO TABS: 500.0000 mg | ORAL_TABLET | Freq: Four times a day (QID) | ORAL | Status: DC | PRN

## 2012-04-11 MED ORDER — OXYCODONE-ACETAMINOPHEN 5-325 MG PO TABS: 1.0000 | ORAL_TABLET | ORAL | Status: DC | PRN

## 2012-04-11 MED ORDER — POLYETHYLENE GLYCOL 3350 17 G PO PACK: 17.0000 g | PACK | Freq: Every day | ORAL | Status: DC | PRN

## 2012-04-11 NOTE — Progress Notes (Signed)
   Subjective: 2 Days Post-Op Procedure(s) (LRB): DECOMPRESSIVE LUMBAR LAMINECTOMY LEVEL 2 (N/A) Patient reports pain as mild.   Patient seen in rounds without Dr. Darrelyn Hillock. Patient is well, and has had no acute complaints or problems. She is having minimal soreness in the back . No radicular leg pain. No issues overnight. No complaints of shortness of breath or chest pain. She is voiding well. No BM yet. She is ready to go home today.   Objective: Vital signs in last 24 hours: Temp:  [98 F (36.7 C)-101.6 F (38.7 C)] 98 F (36.7 C) (01/17 0533) Pulse Rate:  [73-96] 78  (01/17 0533) Resp:  [14-16] 14  (01/17 0533) BP: (101-126)/(65-78) 114/78 mmHg (01/17 0533) SpO2:  [94 %-96 %] 95 % (01/17 0533)  Intake/Output from previous day:  Intake/Output Summary (Last 24 hours) at 04/11/12 0746 Last data filed at 04/11/12 0534  Gross per 24 hour  Intake   1080 ml  Output   1500 ml  Net   -420 ml     EXAM General - Patient is Alert and Oriented Extremity - Neurologically intact Neurovascular intact Dorsiflexion/Plantar flexion intact Dressing/Incision - clean, dry, no drainage Motor Function - intact, moving foot and toes well on exam.   Past Medical History  Diagnosis Date  . Hypertension   . Seasonal allergies   . Diabetes mellitus without complication     oral meds only  . Hypercholesteremia   . Spinal stenosis     lumbar    Assessment/Plan: 2 Days Post-Op Procedure(s) (LRB): DECOMPRESSIVE LUMBAR LAMINECTOMY LEVEL 2 (N/A) Active Problems:  Spinal stenosis of lumbar region with radiculopathy  Estimated Body mass index is 40.19 kg/(m^2) as calculated from the following:   Height as of this encounter: 4\' 11" (1.499 m).   Weight as of this encounter: 199 lb(90.266 kg). Advance diet Up with therapy Discharge home  Weight-Bearing as tolerated   She is doing very well. Will discharge home today. Discussed discharge instructions with the patient. Will work with therapy  before discharge today. Plan to follow up in office in 2 weeks.   Brinn Westby LAUREN 04/11/2012, 7:46 AM

## 2012-04-11 NOTE — Progress Notes (Signed)
Physical Therapy Treatment Patient Details Name: Cindy Glover MRN: [id number] DOB: 12-04-1945 Today's Date: 04/11/2012 Time: 4098-1191 PT Time Calculation (min): 17 min  PT Assessment / Plan / Recommendation Comments on Treatment Session  pt progressing well    Follow Up Recommendations  No PT follow up;Outpatient PT     Does the patient have the potential to tolerate intense rehabilitation     Barriers to Discharge        Equipment Recommendations  None recommended by PT (pt states she can borrow RW if needed)    Recommendations for Other Services    Frequency     Plan      Precautions / Restrictions Precautions Precautions: Back Precaution Comments: reviewed back precautions   Pertinent Vitals/Pain     Mobility  Bed Mobility Bed Mobility: Rolling Left;Left Sidelying to Sit;Sit to Sidelying Left Rolling Left: 6: Modified independent (Device/Increase time) Left Sidelying to Sit: 6: Modified independent (Device/Increase time) Transfers Transfers: Sit to Stand;Stand to Sit Sit to Stand: 6: Modified independent (Device/Increase time) Stand to Sit: 6: Modified independent (Device/Increase time) Ambulation/Gait Ambulation/Gait Assistance: 5: Supervision;6: Modified independent (Device/Increase time) Ambulation Distance (Feet): 300 Feet Assistive device: Rolling walker;None;1 person hand held assist Ambulation/Gait Assistance Details: cues posture Gait Pattern: Step-through pattern Stairs: Yes Stairs Assistance: 4: Min guard Stair Management Technique: One rail Right;Forwards Number of Stairs: 4     Exercises     PT Diagnosis:    PT Problem List:   PT Treatment Interventions:     PT Goals Acute Rehab PT Goals Pt will go Supine/Side to Sit: with supervision PT Goal: Supine/Side to Sit - Progress: Met Pt will go Sit to Supine/Side: with supervision Pt will go Sit to Stand: with supervision PT Goal: Sit to Stand - Progress: Met Pt will go Stand to Sit: with  supervision PT Goal: Stand to Sit - Progress: Met Pt will Ambulate: with modified independence;16 - 50 feet;with least restrictive assistive device PT Goal: Ambulate - Progress: Met Pt will Go Up / Down Stairs: 3-5 stairs;with min assist;with rail(s) PT Goal: Up/Down Stairs - Progress: Met  Visit Information  Last PT Received On: 04/11/12 Assistance Needed: +1    Subjective Data  Subjective: I feel good   Cognition  Overall Cognitive Status: Appears within functional limits for tasks assessed/performed Arousal/Alertness: Awake/alert Orientation Level: Appears intact for tasks assessed Behavior During Session: [facility] for tasks performed    Balance     End of Session PT - End of Session Activity Tolerance: Patient tolerated treatment well Patient left: Other (comment) (EOB with OT)   GP     [facility] 04/11/2012, 9:06 AM

## 2012-04-11 NOTE — Progress Notes (Signed)
Occupational Therapy Treatment Patient Details Name: BOOTS MCGLOWN MRN: [id number] DOB: [birth year] Today's Date: 04/11/2012 Time: 0902-0911 OT Time Calculation (min): 9 min  OT Assessment / Plan / Recommendation Comments on Treatment Session      Follow Up Recommendations   No further OT    Barriers to Discharge       Equipment Recommendations   No DME recommended from OT    Recommendations for Other Services    Frequency     Plan      Precautions / Restrictions Precautions Precautions: Back Precaution Comments: reviewed back precautions Restrictions Weight Bearing Restrictions: No   Pertinent Vitals/Pain Back pain 3/10 after therapy.  Repositioned and requested pain medication    ADL  Upper Body Dressing: Performed;Set up Where Assessed - Upper Body Dressing: Unsupported sitting Lower Body Dressing: Performed;Moderate assistance Where Assessed - Lower Body Dressing: Unsupported sit to stand Toilet Transfer: Simulated;Modified independent (pt has been using bathroom a lot; sat in low chair) Toileting - Clothing Manipulation and Hygiene: Simulated;Modified independent (via LB dressing) Where Assessed - Toileting Clothing Manipulation and Hygiene: Standing Tub/Shower Transfer: Simulated;Modified independent (our shower is accessible--no ledge) Transfers/Ambulation Related to ADLs: ambulated to bathroom at mod I level ADL Comments: Pt following back precautions during functional activities without cues.  Husband will help with LB dressing.      OT Diagnosis:    OT Problem List:   OT Treatment Interventions:     OT Goals ADL Goals Pt Will Transfer to Toilet: with supervision;Ambulation;Regular height toilet ADL Goal: Toilet Transfer - Progress: Met Pt Will Perform Tub/Shower Transfer: Shower transfer;with supervision;Ambulation ADL Goal: Tub/Shower Transfer - Progress: Met Miscellaneous OT Goals Miscellaneous OT Goal #1: Pt will verbalize use of AE and modifications  for ADLs to follow back precautions without cues OT Goal: Miscellaneous Goal #1 - Progress: Met Miscellaneous OT Goal #2: Pt will ask for assistance as needed during adl OT Goal: Miscellaneous Goal #2 - Progress: Met  Visit Information  Last OT Received On: 04/11/12 Assistance Needed: +1    Subjective Data      Prior Functioning       Cognition  Overall Cognitive Status: Appears within functional limits for tasks assessed/performed Arousal/Alertness: Awake/alert Orientation Level: Appears intact for tasks assessed Behavior During Session: [facility] for tasks performed    Mobility  Shoulder Instructions Bed Mobility Bed Mobility: Rolling Left;Left Sidelying to Sit;Sit to Sidelying Left Rolling Left: 6: Modified independent (Device/Increase time) Left Sidelying to Sit: 6: Modified independent (Device/Increase time) Transfers Sit to Stand: 6: Modified independent (Device/Increase time) Stand to Sit: 6: Modified independent (Device/Increase time)       Exercises      Balance     End of Session OT - End of Session Activity Tolerance: Patient tolerated treatment well Patient left: in chair;with call bell/phone within reach  GO     [facility] 04/11/2012, 9:31 AM Marica Otter, OTR/L [phone] 04/11/2012
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
## 2013-06-23 ENCOUNTER — Other Ambulatory Visit: Payer: Self-pay | Admitting: Orthopedic Surgery

## 2013-06-23 DIAGNOSIS — Z9889 Other specified postprocedural states: Secondary | ICD-10-CM

## 2013-06-24 ENCOUNTER — Ambulatory Visit
Admission: RE | Admit: 2013-06-24 | Discharge: 2013-06-24 | Disposition: A | Discharge status: Home / Self Care | Payer: Medicare Other | Source: Ambulatory Visit | Attending: Orthopedic Surgery | Admitting: Orthopedic Surgery

## 2013-06-24 VITALS — BP 136/66 | HR 64

## 2013-06-24 DIAGNOSIS — Z9889 Other specified postprocedural states: Secondary | ICD-10-CM

## 2013-06-24 DIAGNOSIS — M5416 Radiculopathy, lumbar region: Secondary | ICD-10-CM

## 2013-06-24 DIAGNOSIS — M48061 Spinal stenosis, lumbar region without neurogenic claudication: Secondary | ICD-10-CM

## 2013-06-24 MED ORDER — IOHEXOL 180 MG/ML  SOLN
15.0000 mL | Freq: Once | INTRAMUSCULAR | Status: AC | PRN
  Administered 2013-06-24: 15 mL via INTRATHECAL

## 2013-06-24 MED ORDER — DIAZEPAM 5 MG PO TABS
5.0000 mg | ORAL_TABLET | Freq: Once | ORAL | Status: AC
  Administered 2013-06-24: 5 mg via ORAL

## 2013-06-24 NOTE — Discharge Instructions (Signed)

## 2014-12-26 ENCOUNTER — Encounter (HOSPITAL_COMMUNITY): Payer: Self-pay | Admitting: *Deleted

## 2014-12-26 ENCOUNTER — Emergency Department (HOSPITAL_COMMUNITY)
Admission: EM | Admit: 2014-12-26 | Discharge: 2014-12-26 | Disposition: A | Discharge status: Home / Self Care | Payer: Medicare Other | Attending: Emergency Medicine | Admitting: Emergency Medicine

## 2014-12-26 DIAGNOSIS — E119 Type 2 diabetes mellitus without complications: Secondary | ICD-10-CM | POA: Insufficient documentation

## 2014-12-26 DIAGNOSIS — M545 Low back pain, unspecified: Secondary | ICD-10-CM

## 2014-12-26 DIAGNOSIS — I10 Essential (primary) hypertension: Secondary | ICD-10-CM | POA: Insufficient documentation

## 2014-12-26 DIAGNOSIS — M79604 Pain in right leg: Secondary | ICD-10-CM | POA: Diagnosis present

## 2014-12-26 DIAGNOSIS — Z79899 Other long term (current) drug therapy: Secondary | ICD-10-CM | POA: Insufficient documentation

## 2014-12-26 LAB — CBC WITH DIFFERENTIAL/PLATELET
Basophils Absolute: 0 10*3/uL (ref 0.0–0.1)
Basophils Relative: 0 %
EOS PCT: 1 %
Eosinophils Absolute: 0.1 10*3/uL (ref 0.0–0.7)
HEMATOCRIT: 33.9 % — AB (ref 36.0–46.0)
Hemoglobin: 11.2 g/dL — ABNORMAL LOW (ref 12.0–15.0)
LYMPHS ABS: 1.2 10*3/uL (ref 0.7–4.0)
LYMPHS PCT: 14 %
MCH: 29 pg (ref 26.0–34.0)
MCHC: 33 g/dL (ref 30.0–36.0)
MCV: 87.8 fL (ref 78.0–100.0)
MONO ABS: 0.5 10*3/uL (ref 0.1–1.0)
MONOS PCT: 6 %
Neutro Abs: 6.9 10*3/uL (ref 1.7–7.7)
Neutrophils Relative %: 79 %
PLATELETS: 360 10*3/uL (ref 150–400)
RBC: 3.86 MIL/uL — ABNORMAL LOW (ref 3.87–5.11)
RDW: 13.9 % (ref 11.5–15.5)
WBC: 8.7 10*3/uL (ref 4.0–10.5)

## 2014-12-26 LAB — BASIC METABOLIC PANEL
Anion gap: 8 (ref 5–15)
BUN: 35 mg/dL — AB (ref 6–20)
CHLORIDE: 109 mmol/L (ref 101–111)
CO2: 21 mmol/L — AB (ref 22–32)
Calcium: 8.9 mg/dL (ref 8.9–10.3)
Creatinine, Ser: 1.3 mg/dL — ABNORMAL HIGH (ref 0.44–1.00)
GFR calc Af Amer: 48 mL/min — ABNORMAL LOW (ref >60)
GFR, EST NON AFRICAN AMERICAN: 41 mL/min — AB (ref >60)
GLUCOSE: 142 mg/dL — AB (ref 65–99)
POTASSIUM: 3.7 mmol/L (ref 3.5–5.1)
Sodium: 138 mmol/L (ref 135–145)

## 2014-12-26 MED ORDER — PREDNISONE 10 MG (21) PO TBPK: 10.0000 mg | ORAL_TABLET | Freq: Every day | ORAL | Status: AC

## 2014-12-26 MED ORDER — PREDNISONE 20 MG PO TABS: 20.0000 mg | ORAL_TABLET | Freq: Every day | ORAL | Status: DC

## 2014-12-26 MED ORDER — DIAZEPAM 5 MG/ML IJ SOLN
5.0000 mg | Freq: Once | INTRAMUSCULAR | Status: AC
  Administered 2014-12-26: 5 mg via INTRAVENOUS
  Filled 2014-12-26: qty 2

## 2014-12-26 MED ORDER — DEXAMETHASONE SODIUM PHOSPHATE 10 MG/ML IJ SOLN
10.0000 mg | Freq: Once | INTRAMUSCULAR | Status: AC
  Administered 2014-12-26: 10 mg via INTRAVENOUS
  Filled 2014-12-26: qty 1

## 2014-12-26 MED ORDER — OXYCODONE-ACETAMINOPHEN 5-325 MG PO TABS: 1.0000 | ORAL_TABLET | ORAL | Status: DC | PRN

## 2014-12-26 MED ORDER — METHOCARBAMOL 1000 MG/10ML IJ SOLN
1000.0000 mg | Freq: Once | INTRAVENOUS | Status: AC
  Administered 2014-12-26: 1000 mg via INTRAVENOUS
  Filled 2014-12-26: qty 10

## 2014-12-26 MED ORDER — OXYCODONE-ACETAMINOPHEN 5-325 MG PO TABS
2.0000 | ORAL_TABLET | Freq: Once | ORAL | Status: AC
  Administered 2014-12-26: 2 via ORAL
  Filled 2014-12-26: qty 2

## 2014-12-26 MED ORDER — METHOCARBAMOL 500 MG PO TABS: 500.0000 mg | ORAL_TABLET | Freq: Four times a day (QID) | ORAL | Status: AC | PRN

## 2014-12-26 MED ORDER — ONDANSETRON HCL 4 MG/2ML IJ SOLN
4.0000 mg | Freq: Once | INTRAMUSCULAR | Status: AC
  Administered 2014-12-26: 4 mg via INTRAVENOUS
  Filled 2014-12-26: qty 2

## 2014-12-26 MED ORDER — KETOROLAC TROMETHAMINE 30 MG/ML IJ SOLN
15.0000 mg | Freq: Once | INTRAMUSCULAR | Status: AC
  Administered 2014-12-26: 15 mg via INTRAVENOUS
  Filled 2014-12-26: qty 1

## 2014-12-26 MED ORDER — HYDROMORPHONE HCL 1 MG/ML IJ SOLN
1.0000 mg | Freq: Once | INTRAMUSCULAR | Status: AC
  Administered 2014-12-26: 1 mg via INTRAVENOUS
  Filled 2014-12-26: qty 1

## 2014-12-26 MED ORDER — OXYCODONE-ACETAMINOPHEN 5-325 MG PO TABS: 1.0000 | ORAL_TABLET | ORAL | Status: AC | PRN

## 2014-12-26 NOTE — ED Notes (Signed)
Pt reports pt sees ortho Dr Wynelle Cleveland, pt has had back pain and bilateral upper leg pain since August, saw Dr Wynelle Cleveland, was put on prednisone and pain meds. Left leg pain improved. Back pain and right leg pain increased x1 week, pain has vastly increased over last 4 days. Before today pt was able ambulated with cane, increased difficulty ambulating today. Pain 9/10  Pt had back surgery Jan 2014, after that pt had right thigh burning pain, was told that was the nerve healing. Pt reports 2 weeks ago thigh burning pain restarted. 2-3x/ night.

## 2014-12-26 NOTE — ED Provider Notes (Signed)
CSN: [id number]     Arrival date & time 12/26/14  1111 History   First MD Initiated Contact with Patient 12/26/14 1130     Chief Complaint  Patient presents with  . Leg Pain     (Consider location/radiation/quality/duration/timing/severity/associated sxs/prior Treatment) The history is provided by the patient, the spouse and medical records.     Pt with hx spinal stenosis and radiculopathy s/p decompressive lumbar laminectomy in early 2014 by Dr Darrelyn Hillock with intermittent pain episodes radiating down one leg or the other since then.  She has now had 8 days of low back and right leg pain that is burning and severe.  Has a few percocet 5-325 at home and she took one yesterday.  Has taken nothing for pain today.  Usually walks with a cane but has gotten progressively worse in terms of pain and mobility this week.  Today she is having too much pain to ambulate, even with a walker.  Denies any recent injury, heavy lifting, or fall.   Denies fevers, chills, abdominal pain, loss of control of bowel or bladder, weakness of numbness of the extremities, saddle anesthesia, bowel, urinary, or vaginal complaints.    Past Medical History  Diagnosis Date  . Hypertension   . Seasonal allergies   . Diabetes mellitus without complication (HCC)     oral meds only  . Hypercholesteremia   . Spinal stenosis     lumbar   Past Surgical History  Procedure Laterality Date  . Cholecystectomy      open  . Breast surgery      bilateral breast reduction  . Abdominal hysterectomy    . Dilation and curettage of uterus    . Decompressive lumbar laminectomy level 2  04/09/2012    Procedure: DECOMPRESSIVE LUMBAR LAMINECTOMY LEVEL 2;  Surgeon: Jacki Cones, MD;  Location: [facility];  Service: Orthopedics;  Laterality: N/A;  COMPLETE LAMINECTOMY L5 ON THE LEFT AND DECOMPRESSION L5-S1 AND NERVE ROOT ON THE LEFT   History reviewed. No pertinent family history. Social History  Substance Use Topics  . Smoking status:  Never Smoker   . Smokeless tobacco: Never Used  . Alcohol Use: No   OB History    No data available     Review of Systems  All other systems reviewed and are negative.     Allergies  Ibuprofen and Morphine and related  Home Medications   Prior to Admission medications   Medication Sig Start Date End Date Taking? Authorizing Provider  acetaminophen (TYLENOL) 500 MG tablet Take 1,000 mg by mouth every 6 (six) hours as needed for moderate pain.   Yes Historical Provider, MD  alendronate (FOSAMAX) 70 MG tablet Take 70 mg by mouth every Saturday at 6 PM. Take with a full glass of water on an empty stomach.   Yes Historical Provider, MD  amLODipine-atorvastatin (CADUET) 10-40 MG per tablet Take 1 tablet by mouth every morning.   Yes Historical Provider, MD  cetirizine (ZYRTEC) 10 MG tablet Take 10 mg by mouth daily as needed. For allergies   Yes Historical Provider, MD  ezetimibe (ZETIA) 10 MG tablet Take 10 mg by mouth every morning.   Yes Historical Provider, MD  gabapentin (NEURONTIN) 300 MG capsule Take 300 mg by mouth 2 (two) times daily. 12/08/14  Yes Historical Provider, MD  glipiZIDE (GLUCOTROL XL) 10 MG 24 hr tablet Take 10 mg by mouth every evening.    Yes Historical Provider, MD  hydrochlorothiazide (HYDRODIURIL) 25 MG tablet Take  25 mg by mouth daily as needed. For swelling   Yes Historical Provider, MD  omeprazole (PRILOSEC) 20 MG capsule Take 20 mg by mouth at bedtime. 12/02/14  Yes Historical Provider, MD  oxyCODONE-acetaminophen (PERCOCET/ROXICET) 5-325 MG per tablet Take 1-2 tablets by mouth every 4 (four) hours as needed. Patient taking differently: Take 1-2 tablets by mouth every 4 (four) hours as needed for moderate pain.  04/11/12  Yes Amber Celedonio Savage, PA-C  pioglitazone (ACTOS) 45 MG tablet Take 45 mg by mouth every morning.   Yes Historical Provider, MD  ramipril (ALTACE) 10 MG capsule Take 10 mg by mouth every morning.   Yes Historical Provider, MD  FLUZONE HIGH-DOSE  0.5 ML SUSY Inject 0.5 mLs into the skin once. 12/16/14   Historical Provider, MD   BP 132/60 mmHg  Pulse 89  Temp(Src) 98.1 F (36.7 C) (Oral)  Resp 16  SpO2 99% Physical Exam  Constitutional: She appears well-developed and well-nourished. No distress.  HENT:  Head: Normocephalic and atraumatic.  Neck: Neck supple.  Pulmonary/Chest: Effort normal.  Abdominal: Soft. She exhibits no distension and no mass. There is no tenderness. There is no rebound and no guarding.  Musculoskeletal:  Spine nontender, no crepitus, or stepoffs. Lower extremities:  Strength 5/5, sensation intact, distal pulses intact.     Neurological: She is alert.  Skin: She is not diaphoretic.  Nursing note and vitals reviewed.   ED Course  Procedures (including critical care time) Labs Review Labs Reviewed  BASIC METABOLIC PANEL - Abnormal; Notable for the following:    CO2 21 (*)    Glucose, Bld 142 (*)    BUN 35 (*)    Creatinine, Ser 1.30 (*)    GFR calc non Af Amer 41 (*)    GFR calc Af Amer 48 (*)    All other components within normal limits  CBC WITH DIFFERENTIAL/PLATELET - Abnormal; Notable for the following:    RBC 3.86 (*)    Hemoglobin 11.2 (*)    HCT 33.9 (*)    All other components within normal limits    Imaging Review No results found. I have personally reviewed and evaluated these images and lab results as part of my medical decision-making.   EKG Interpretation None       1:22 PM Discussed pt with Dr Adela Lank.    1:54 PM Pt reports she is feeling much better, thinks she will be able to walk now.   2:05 PM Pt walked a few steps, which is improvement, but had too much pain.  Will add narcotic pain medication.    4:18 PM Pt ambulated out into the hall with assistance at a fast pace.  Feels comfortable going home.    MDM   Final diagnoses:  Low back pain radiating to right lower extremity   Afebrile, nontoxic patient with acute on chronic low back pain with radiculopathy into  right leg.  No red flags.  Pain controlled in ED, pt able to ambulate with assistance.   D/C home with percocet, robaxin, prednisone, ortho follow up Dr Darrelyn Hillock.  Pt also requested referral to neurosurgery.   Discussed result, findings, treatment, and follow up  with patient.  Pt given return precautions.  Pt verbalizes understanding and agrees with plan.         Trixie Dredge, PA-C 12/26/14 1639  Melene Plan, DO 12/28/14 [phone]

## 2014-12-26 NOTE — Discharge Instructions (Signed)
Read the information below.  Use the prescribed medication as directed.  Please discuss all new medications with your pharmacist.  Do not take additional tylenol while taking the prescribed pain medication to avoid overdose.  You may return to the Emergency Department at any time for worsening condition or any new symptoms that concern you.   If you develop fevers, loss of control of bowel or bladder, weakness or numbness in your legs, or are unable to walk, return to the ER for a recheck.  ° °Back Pain, Adult °Low back pain is very common. About 1 in 5 people have back pain. The cause of low back pain is rarely dangerous. The pain often gets better over time. About half of people with a sudden onset of back pain feel better in just 2 weeks. About 8 in 10 people feel better by 6 weeks.  °CAUSES °Some common causes of back pain include: °· Strain of the muscles or ligaments supporting the spine. °· Wear and tear (degeneration) of the spinal discs. °· Arthritis. °· Direct injury to the back. °DIAGNOSIS °Most of the time, the direct cause of low back pain is not known. However, back pain can be treated effectively even when the exact cause of the pain is unknown. Answering your caregiver's questions about your overall health and symptoms is one of the most accurate ways to make sure the cause of your pain is not dangerous. If your caregiver needs more information, he or she may order lab work or imaging tests (X-rays or MRIs). However, even if imaging tests show changes in your back, this usually does not require surgery. °HOME CARE INSTRUCTIONS °For many people, back pain returns. Since low back pain is rarely dangerous, it is often a condition that people can learn to manage on their own.  °· Remain active. It is stressful on the back to sit or stand in one place. Do not sit, drive, or stand in one place for more than 30 minutes at a time. Take short walks on level surfaces as soon as pain allows. Try to increase the  length of time you walk each day. °· Do not stay in bed. Resting more than 1 or 2 days can delay your recovery. °· Do not avoid exercise or work. Your body is made to move. It is not dangerous to be active, even though your back may hurt. Your back will likely heal faster if you return to being active before your pain is gone. °· Pay attention to your body when you  bend and lift. Many people have less discomfort when lifting if they bend their knees, keep the load close to their bodies, and avoid twisting. Often, the most comfortable positions are those that put less stress on your recovering back. °· Find a comfortable position to sleep. Use a firm mattress and lie on your side with your knees slightly bent. If you lie on your back, put a pillow under your knees. °· Only take over-the-counter or prescription medicines as directed by your caregiver. Over-the-counter medicines to reduce pain and inflammation are often the most helpful. Your caregiver may prescribe muscle relaxant drugs. These medicines help dull your pain so you can more quickly return to your normal activities and healthy exercise. °· Put ice on the injured area. °¨ Put ice in a plastic bag. °¨ Place a towel between your skin and the bag. °¨ Leave the ice on for 15-20 minutes, 03-04 times a day for the first 2 to 3 days. After that, ice and heat may be alternated to reduce pain and spasms. °· Ask   your caregiver about trying back exercises and gentle massage. This may be of some benefit. °· Avoid feeling anxious or stressed. Stress increases muscle tension and can worsen back pain. It is important to recognize when you are anxious or stressed and learn ways to manage it. Exercise is a great option. °SEEK MEDICAL CARE IF: °· You have pain that is not relieved with rest or medicine. °· You have pain that does not improve in 1 week. °· You have new symptoms. °· You are generally not feeling well. °SEEK IMMEDIATE MEDICAL CARE IF:  °· You have pain that  radiates from your back into your legs. °· You develop new bowel or bladder control problems. °· You have unusual weakness or numbness in your arms or legs. °· You develop nausea or vomiting. °· You develop abdominal pain. °· You feel faint. °Document Released: 03/12/2005 Document Revised: 09/11/2011 Document Reviewed: 07/14/2013 °[employer]® Patient Information ©2015 [employer]. This information is not intended to replace advice given to you by your health care provider. Make sure you discuss any questions you have with your health care provider. ° °Radicular Pain °Radicular pain in either the arm or leg is usually from a bulging or herniated disk in the spine. A piece of the herniated disk may press against the nerves as the nerves exit the spine. This causes pain which is felt at the tips of the nerves down the arm or leg. Other causes of radicular pain may include: °· Fractures. °· Heart disease. °· Cancer. °· An abnormal and usually degenerative state of the nervous system or nerves (neuropathy). °Diagnosis may require CT or MRI scanning to determine the primary cause.  °Nerves that start at the neck (nerve roots) may cause radicular pain in the outer shoulder and arm. It can spread down to the thumb and fingers. The symptoms vary depending on which nerve root has been affected. In most cases radicular pain improves with conservative treatment. Neck problems may require physical therapy, a neck collar, or cervical traction. Treatment may take many weeks, and surgery may be considered if the symptoms do not improve.  °Conservative treatment is also recommended for sciatica. Sciatica causes pain to radiate from the lower back or buttock area down the leg into the foot. Often there is a history of back problems. Most patients with sciatica are better after 2 to 4 weeks of rest and other supportive care. Short term bed rest can reduce the disk pressure considerably. Sitting, however, is not a good position since  this increases the pressure on the disk. You should avoid bending, lifting, and all other activities which make the problem worse. Traction can be used in severe cases. Surgery is usually reserved for patients who do not improve within the first months of treatment. °Only take over-the-counter or prescription medicines for pain, discomfort, or fever as directed by your caregiver. Narcotics and muscle relaxants may help by relieving more severe pain and spasm and by providing mild sedation. Cold or massage can give significant relief. Spinal manipulation is not recommended. It can increase the degree of disc protrusion. Epidural steroid injections are often effective treatment for radicular pain. These injections deliver medicine to the spinal nerve in the space between the protective covering of the spinal cord and back bones (vertebrae). Your caregiver can give you more information about steroid injections. These injections are most effective when given within two weeks of the onset of pain.  °You should see your caregiver for follow up care as recommended. A program for   neck and back injury rehabilitation with stretching and strengthening exercises is an important part of management.  °SEEK IMMEDIATE MEDICAL CARE IF: °· You develop increased pain, weakness, or numbness in your arm or leg. °· You develop difficulty with bladder or bowel control. °· You develop abdominal pain. °Document Released: 04/19/2004 Document Revised: 06/04/2011 Document Reviewed: 07/05/2008 °[employer]® Patient Information ©2015 [employer]. This information is not intended to replace advice given to you by your health care provider. Make sure you discuss any questions you have with your health care provider. ° °

## 2015-01-05 ENCOUNTER — Other Ambulatory Visit: Payer: Self-pay | Admitting: Orthopedic Surgery

## 2015-01-05 DIAGNOSIS — M5417 Radiculopathy, lumbosacral region: Secondary | ICD-10-CM

## 2015-01-06 ENCOUNTER — Ambulatory Visit
Admission: RE | Admit: 2015-01-06 | Discharge: 2015-01-06 | Disposition: A | Discharge status: Home / Self Care | Payer: Medicare Other | Source: Ambulatory Visit | Attending: Orthopedic Surgery | Admitting: Orthopedic Surgery

## 2015-01-06 VITALS — BP 114/58 | HR 56

## 2015-01-06 DIAGNOSIS — M5417 Radiculopathy, lumbosacral region: Secondary | ICD-10-CM

## 2015-01-06 DIAGNOSIS — M48061 Spinal stenosis, lumbar region without neurogenic claudication: Secondary | ICD-10-CM

## 2015-01-06 DIAGNOSIS — M5416 Radiculopathy, lumbar region: Principal | ICD-10-CM

## 2015-01-06 MED ORDER — IOHEXOL 180 MG/ML  SOLN
15.0000 mL | Freq: Once | INTRAMUSCULAR | Status: DC | PRN
  Administered 2015-01-06: 15 mL via INTRATHECAL

## 2015-01-06 MED ORDER — DIAZEPAM 5 MG PO TABS
5.0000 mg | ORAL_TABLET | Freq: Once | ORAL | Status: AC
  Administered 2015-01-06: 5 mg via ORAL

## 2015-01-06 NOTE — Discharge Instructions (Signed)

## 2015-01-25 DEATH — deceased

## 2015-08-26 IMAGING — RF DG MYELOGRAPHY LUMBAR INJ LUMBOSACRAL
13 of 17 series · 13 of 17 positions shown · non-contrast
Comparison: Lumbar myelogram 03/03/2012

CLINICAL DATA: Low back pain extending into the left hip and
anterior left thigh. Prior lumbar laminectomies.
TECHNIQUE: Contiguous axial images were obtained through the Lumbar spine after
the intrathecal infusion of infusion. Coronal and sagittal
reconstructions were obtained of the axial image sets.

[Series 1: (hospital) · 1 of 1 slices shown (1 of 2)]
[im 1/1]
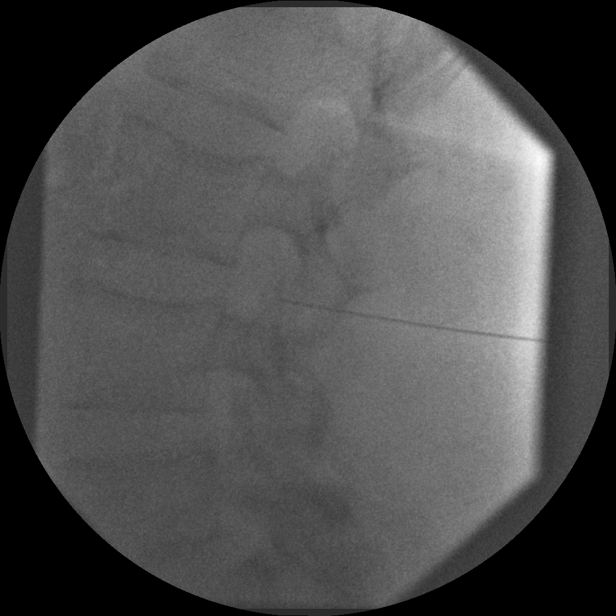

[Series 2: (hospital) · 1 of 1 slices shown (2 of 2)]
[im 1/1]
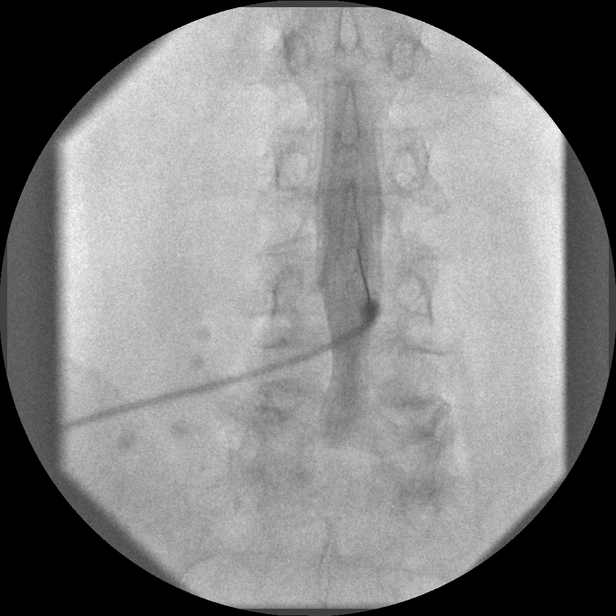

[Series 4: myelogram  white · 1 of 1 slices shown (1 of 9)]
[im 1/1]
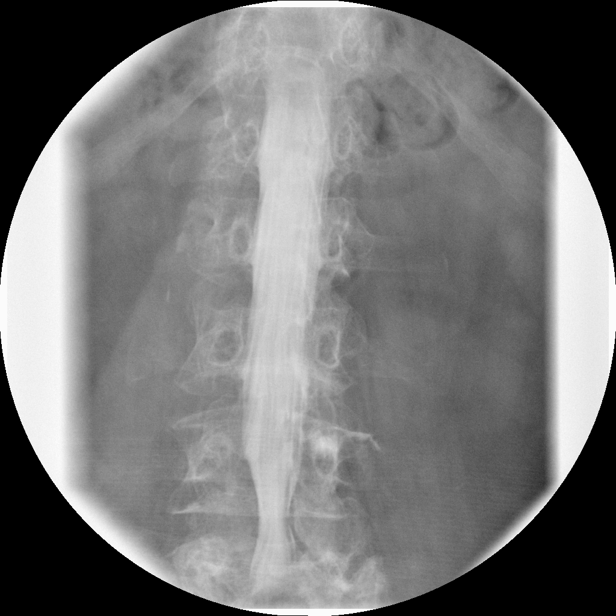

[Series 5: myelogram  white · 1 of 1 slices shown (2 of 9)]
[im 1/1]
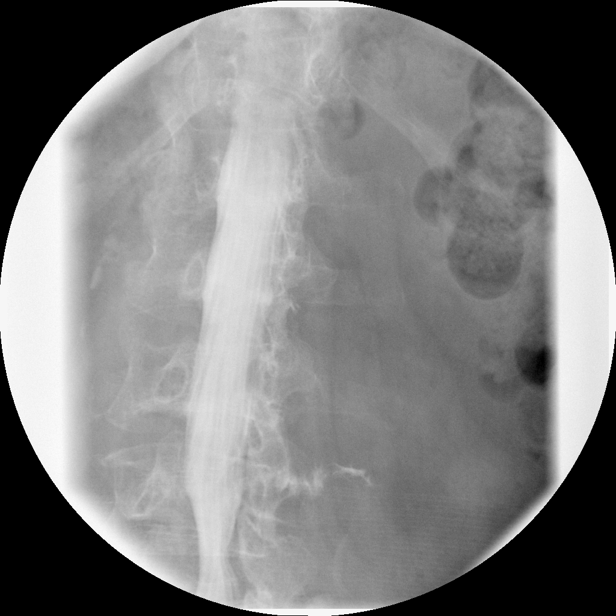

[Series 6: myelogram  white · 1 of 1 slices shown (3 of 9)]
[im 1/1]
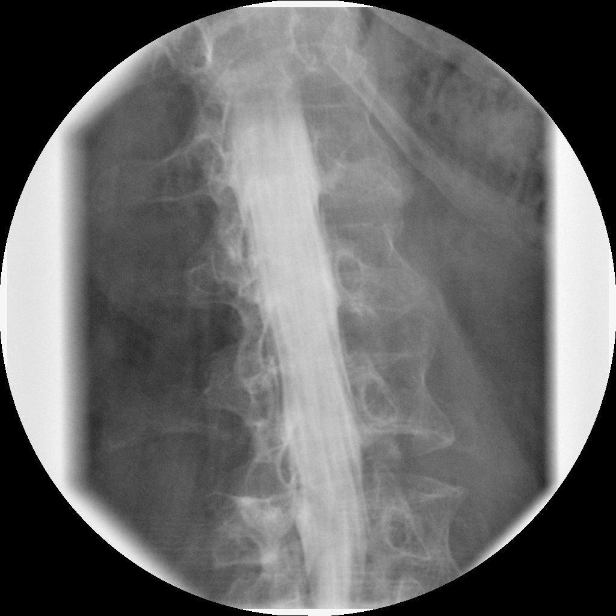

[Series 8: myelogram  white · 1 of 1 slices shown (4 of 9)]
[im 1/1]
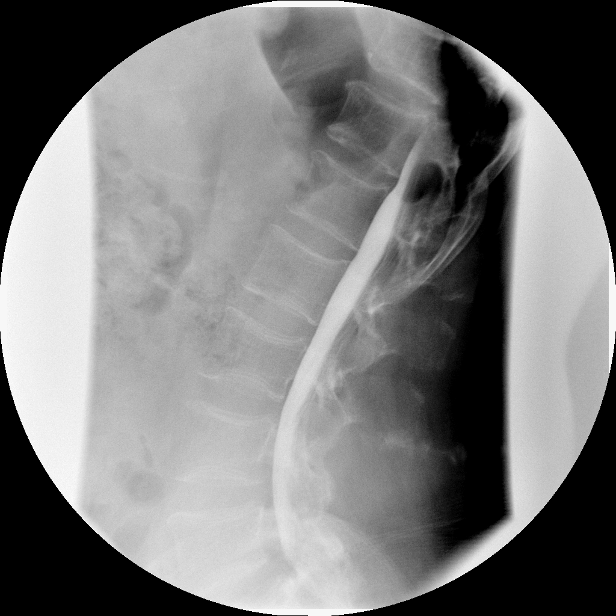

[Series 9: myelogram  white · 1 of 1 slices shown (5 of 9)]
[im 1/1]
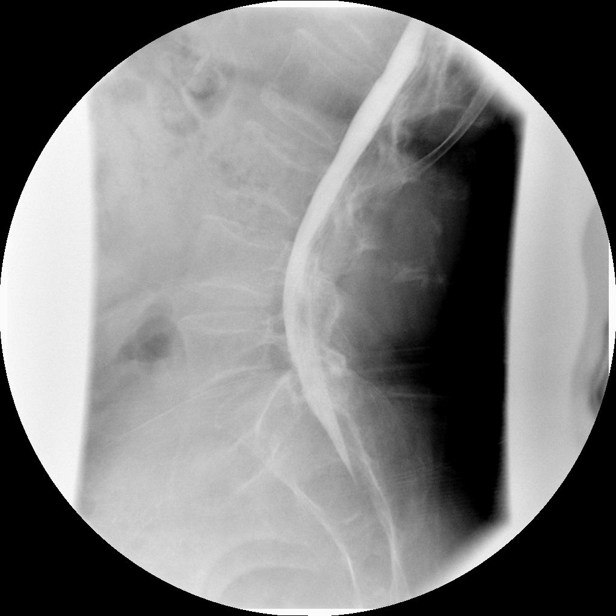

[Series 10: myelogram  white · 1 of 1 slices shown (6 of 9)]
[im 1/1]
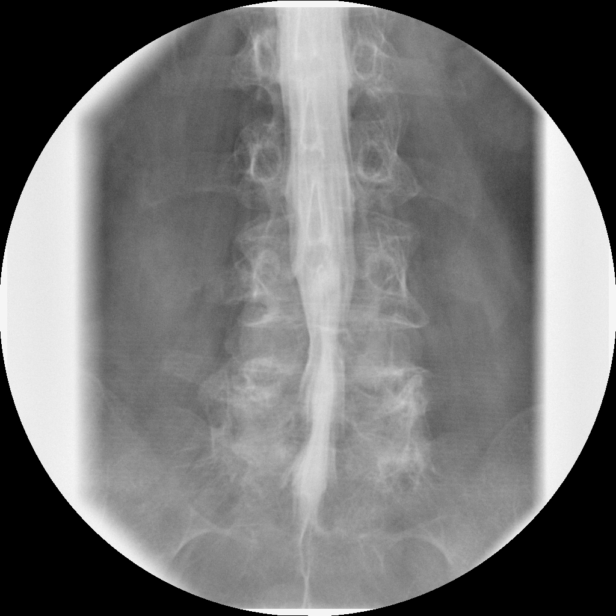

[Series 12: myelogram  white · 1 of 1 slices shown (7 of 9)]
[im 1/1]
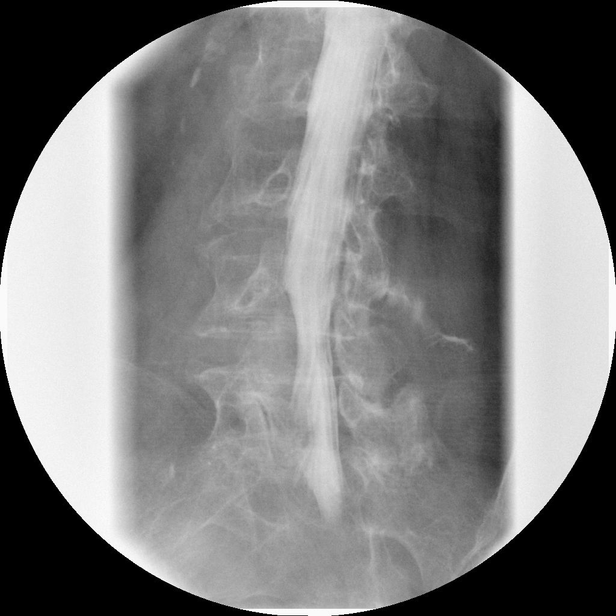

[Series 13: myelogram  white · 1 of 1 slices shown (8 of 9)]
[im 1/1]
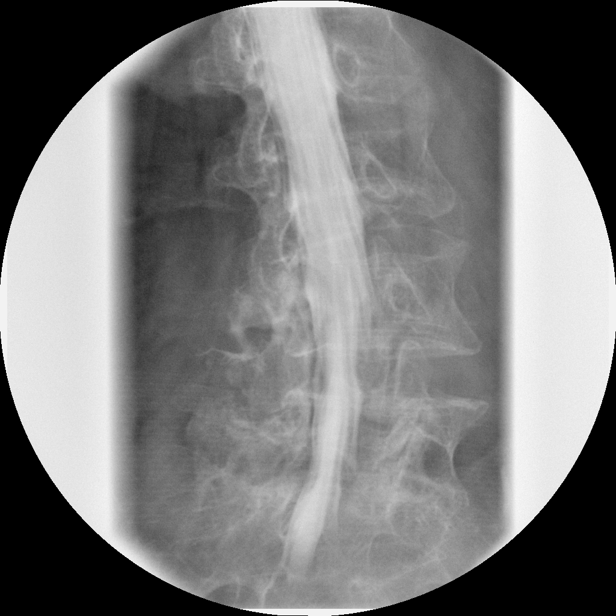

[Series 14: myelogram  white · 1 of 1 slices shown (9 of 9)]
[im 1/1]
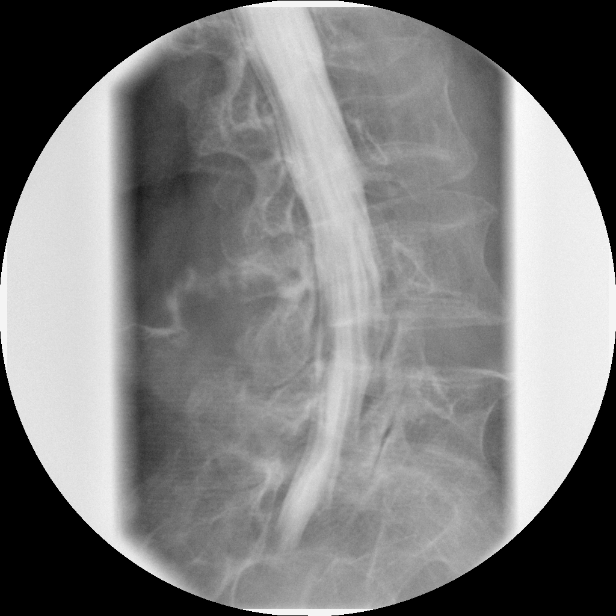

[Series 1002: view not recorded · 0.20mm/px · 1 of 1 slices shown (1 of 2)]
[im 1/1]
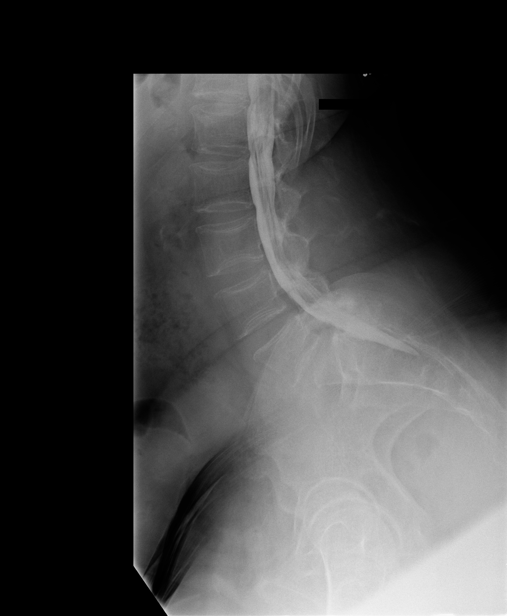

[Series 1003: view not recorded · 0.20mm/px · 1 of 1 slices shown (2 of 2)]
[im 1/1]
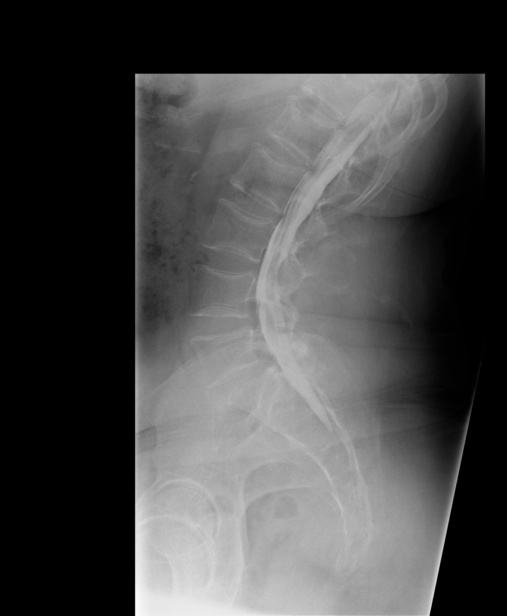

[13 of 17 positions shown; findings below may reference images not displayed]

EXAM:
LUMBAR MYELOGRAM

FLUOROSCOPY TIME:  1 min 37 seconds

PROCEDURE:
After thorough discussion of risks and benefits of the procedure
including bleeding, infection, injury to nerves, blood vessels,
adjacent structures as well as headache and CSF leak, written and
oral informed consent was obtained. Consent was obtained by Dr.
Espe Kong. Time out form was completed.

Patient was positioned prone on the fluoroscopy table. Local
anesthesia was provided with 1% lidocaine without epinephrine after
prepped and draped in the usual sterile fashion. Puncture was
performed at L3-4 using a 3 1/2 inch 22-gauge spinal needle via
right paramedian approach. Using a single pass through the dura, the
needle was placed within the thecal sac, with return of clear CSF.
15 mL of 8mnipaque-J00 was injected into the thecal sac, with normal
opacification of the nerve roots and cauda equina consistent with
free flow within the subarachnoid space.

I personally performed the lumbar puncture and administered the
intrathecal contrast. I also personally supervised acquisition of
the myelogram images.
FINDINGS: LUMBAR MYELOGRAM FINDINGS:

Mild central depression of the superior and inferior endplates of
L3-L5 is unchanged. There is approximately 7 mm of anterolisthesis
of L5 on S1 which does not change during flexion or extension. There
is mild narrowing of the thecal sac at L4-5 and L5-S1. A small
ventral epidural defect is present at T12-L1

CT LUMBAR MYELOGRAM FINDINGS:

7 mm anterolisthesis of L5 on S1 is unchanged. Mild central
depression of the superior and inferior endplates from L3-L5 is
unchanged. The conus medullaris terminates at L1. Scattered
atherosclerotic calcification is noted involving the abdominal aorta
and iliac arteries.

T11-12: Incompletely imaged. A mild disc bulge without evidence of
stenosis, likely unchanged.

T12-L1:  Mild disc bulge without stenosis, unchanged.

L1-2:  Negative.

L2-3:  Negative.

L3-4: Mild facet arthrosis and ligamentum flavum thickening,
unchanged. No disc herniation or stenosis.

L4-5: Interval laminectomies. There is improved patency of the
thecal sac without residual spinal stenosis. Left lateral recess
effacement persists due to prominent epidural fat but is improved
from the prior study. Moderate facet arthrosis results in mild right
neural foraminal stenosis, unchanged.

L5-S1: Interval laminectomies with improved patency of the thecal
sac. Partial effacement of the right lateral recess due to epidural
fat persists but is improved. Listhesis and severe facet arthrosis
resulting in unchanged mild, left greater than right neural
foraminal stenosis.
IMPRESSION: 1. Interval lower lumbar laminectomies with improved patency of the
thecal sac and no residual spinal stenosis.
2. Unchanged anterolisthesis of L5 on S1 without evidence of
instability on upright flexion and extension imaging.
3. Unchanged, mild right neural foraminal stenosis at L4-5 and mild
bilateral neural foraminal narrowing at L5-S1.

## 2015-08-26 IMAGING — CT CT L SPINE W/ CM
4 of 10 series · 12 of 33 positions shown, 14 images · non-contrast
Comparison: Lumbar myelogram 03/03/2012

CLINICAL DATA: Low back pain extending into the left hip and
anterior left thigh. Prior lumbar laminectomies.
TECHNIQUE: Contiguous axial images were obtained through the Lumbar spine after
the intrathecal infusion of infusion. Coronal and sagittal
reconstructions were obtained of the axial image sets.

[Series 2: l spine bone · axial · 0.27mm/px · z∈[-185,-107]mm · 2 of 93 slices shown, 3 images]
[im 31/93  soft-tissue]
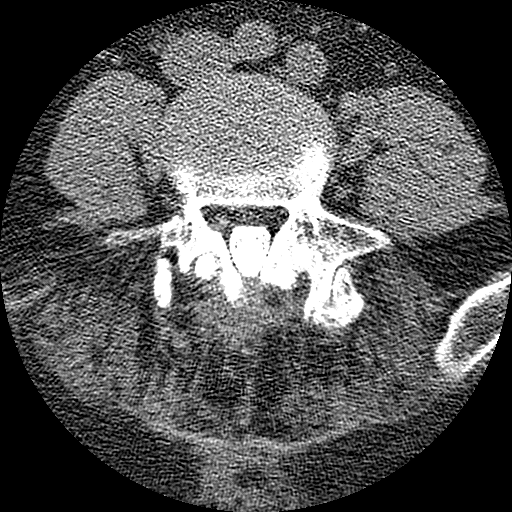
[im 31/93  bone]
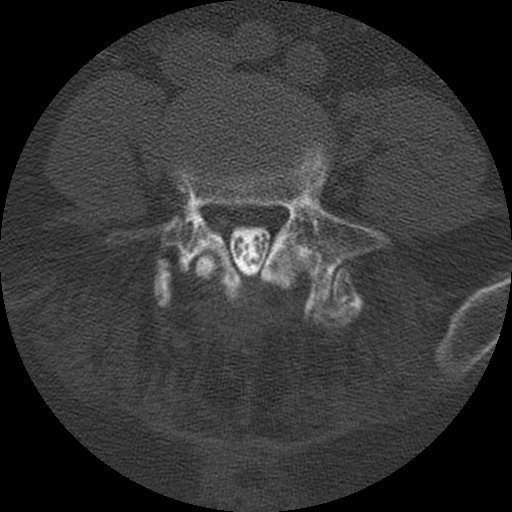
[im 62/93  bone]
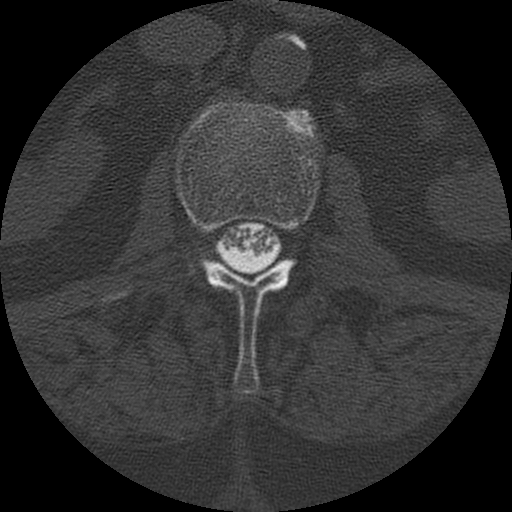

[Series 3: l spine soft · axial · 0.27mm/px · z∈[-202,-87]mm · 3 of 93 slices shown]
[im 24/93  soft-tissue]
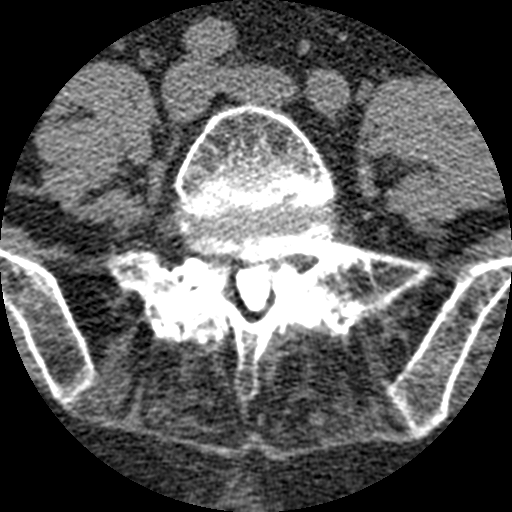
[im 47/93  soft-tissue]
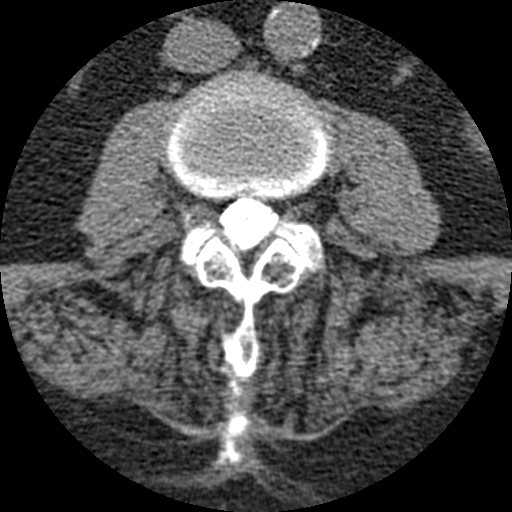
[im 70/93  soft-tissue]
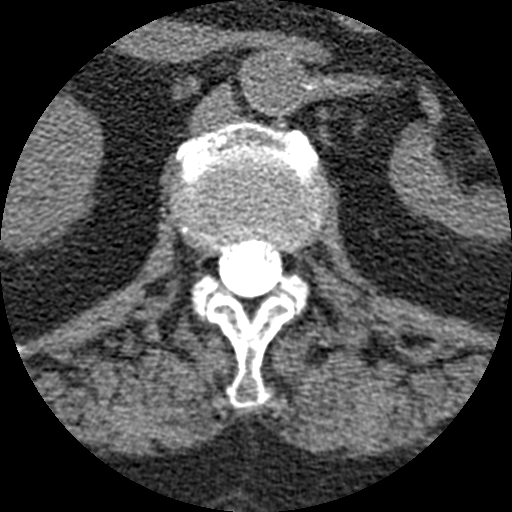

[Series 400: cor upper · coronal · 0.46mm/px · 2 of 60 slices shown]
[im 28/60  bone]
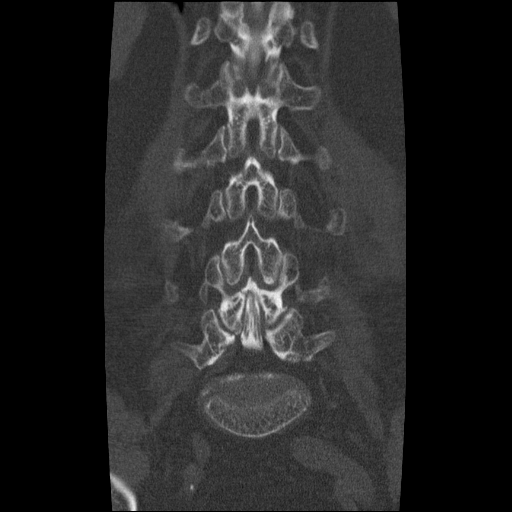
[im 55/60  bone]
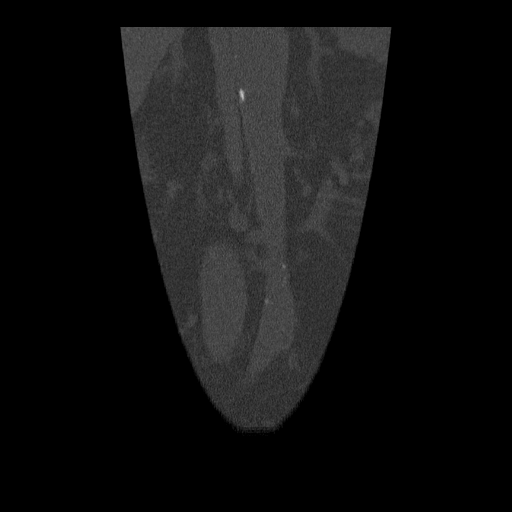

[Series 402: sag · sagittal · 0.46mm/px · 5 of 53 slices shown, 6 images]
[im 18/53  bone]
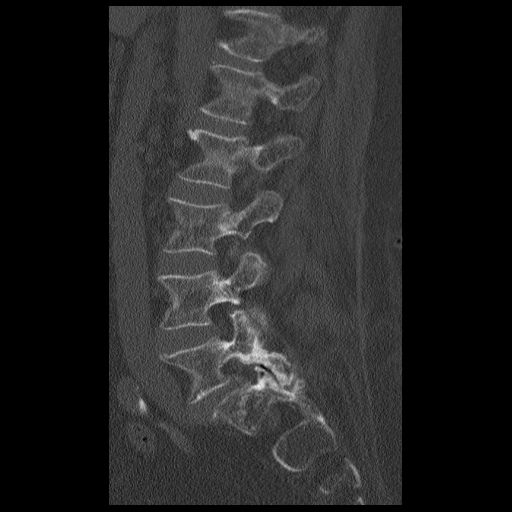
[im 22/53  bone]
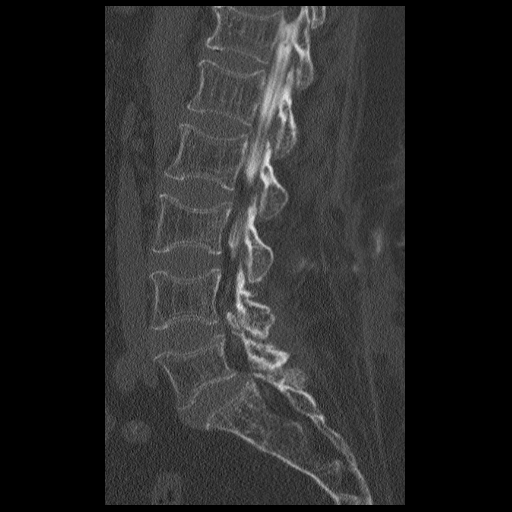
[im 27/53  soft-tissue]
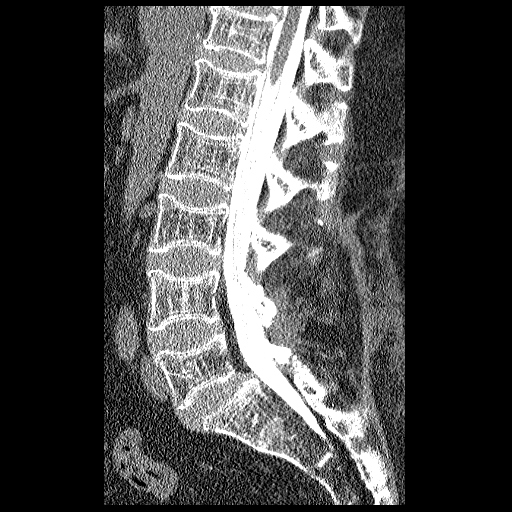
[im 27/53  bone]
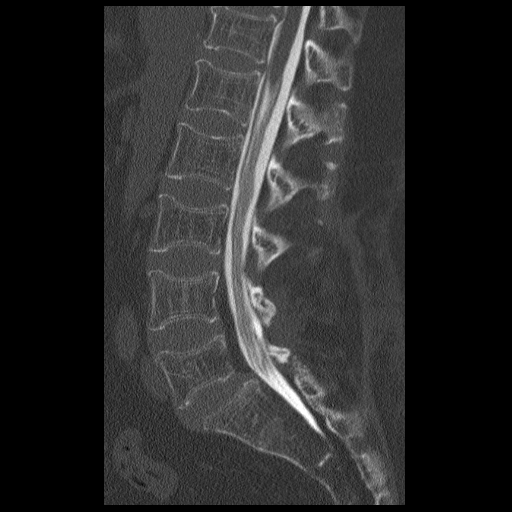
[im 31/53  bone]
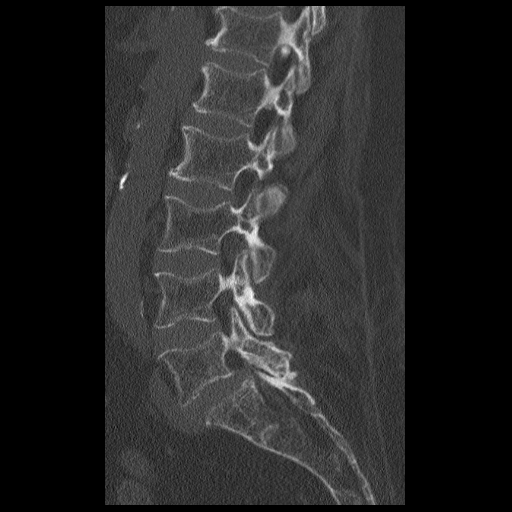
[im 35/53  bone]
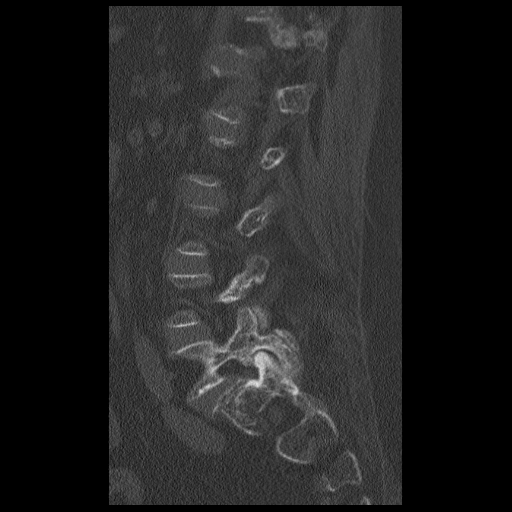

[12 of 33 positions shown; findings below may reference images not displayed]

EXAM:
LUMBAR MYELOGRAM

FLUOROSCOPY TIME:  1 min 37 seconds

PROCEDURE:
After thorough discussion of risks and benefits of the procedure
including bleeding, infection, injury to nerves, blood vessels,
adjacent structures as well as headache and CSF leak, written and
oral informed consent was obtained. Consent was obtained by Dr.
Espe Kong. Time out form was completed.

Patient was positioned prone on the fluoroscopy table. Local
anesthesia was provided with 1% lidocaine without epinephrine after
prepped and draped in the usual sterile fashion. Puncture was
performed at L3-4 using a 3 1/2 inch 22-gauge spinal needle via
right paramedian approach. Using a single pass through the dura, the
needle was placed within the thecal sac, with return of clear CSF.
15 mL of 8mnipaque-J00 was injected into the thecal sac, with normal
opacification of the nerve roots and cauda equina consistent with
free flow within the subarachnoid space.

I personally performed the lumbar puncture and administered the
intrathecal contrast. I also personally supervised acquisition of
the myelogram images.
FINDINGS: LUMBAR MYELOGRAM FINDINGS:

Mild central depression of the superior and inferior endplates of
L3-L5 is unchanged. There is approximately 7 mm of anterolisthesis
of L5 on S1 which does not change during flexion or extension. There
is mild narrowing of the thecal sac at L4-5 and L5-S1. A small
ventral epidural defect is present at T12-L1

CT LUMBAR MYELOGRAM FINDINGS:

7 mm anterolisthesis of L5 on S1 is unchanged. Mild central
depression of the superior and inferior endplates from L3-L5 is
unchanged. The conus medullaris terminates at L1. Scattered
atherosclerotic calcification is noted involving the abdominal aorta
and iliac arteries.

T11-12: Incompletely imaged. A mild disc bulge without evidence of
stenosis, likely unchanged.

T12-L1:  Mild disc bulge without stenosis, unchanged.

L1-2:  Negative.

L2-3:  Negative.

L3-4: Mild facet arthrosis and ligamentum flavum thickening,
unchanged. No disc herniation or stenosis.

L4-5: Interval laminectomies. There is improved patency of the
thecal sac without residual spinal stenosis. Left lateral recess
effacement persists due to prominent epidural fat but is improved
from the prior study. Moderate facet arthrosis results in mild right
neural foraminal stenosis, unchanged.

L5-S1: Interval laminectomies with improved patency of the thecal
sac. Partial effacement of the right lateral recess due to epidural
fat persists but is improved. Listhesis and severe facet arthrosis
resulting in unchanged mild, left greater than right neural
foraminal stenosis.
IMPRESSION: 1. Interval lower lumbar laminectomies with improved patency of the
thecal sac and no residual spinal stenosis.
2. Unchanged anterolisthesis of L5 on S1 without evidence of
instability on upright flexion and extension imaging.
3. Unchanged, mild right neural foraminal stenosis at L4-5 and mild
bilateral neural foraminal narrowing at L5-S1.

## 2019-04-27 ENCOUNTER — Encounter: Payer: Self-pay | Admitting: Gastroenterology
